# Patient Record
Sex: Female | Born: 1935 | Race: White | Hispanic: No | Smoking: Current every day smoker
Health system: Southern US, Community
[De-identification: ages and names within clinical notes are randomized; demographics above are authoritative.]

## PROBLEM LIST (undated history)

## (undated) DIAGNOSIS — C801 Malignant (primary) neoplasm, unspecified: Secondary | ICD-10-CM

## (undated) DIAGNOSIS — I10 Essential (primary) hypertension: Secondary | ICD-10-CM

## (undated) DIAGNOSIS — R011 Cardiac murmur, unspecified: Secondary | ICD-10-CM

## (undated) DIAGNOSIS — J449 Chronic obstructive pulmonary disease, unspecified: Secondary | ICD-10-CM

## (undated) DIAGNOSIS — F329 Major depressive disorder, single episode, unspecified: Secondary | ICD-10-CM

## (undated) DIAGNOSIS — M1990 Unspecified osteoarthritis, unspecified site: Secondary | ICD-10-CM

## (undated) DIAGNOSIS — F32A Depression, unspecified: Secondary | ICD-10-CM

## (undated) DIAGNOSIS — M810 Age-related osteoporosis without current pathological fracture: Secondary | ICD-10-CM

## (undated) DIAGNOSIS — H269 Unspecified cataract: Secondary | ICD-10-CM

## (undated) HISTORY — DX: Depression, unspecified: F32.A

## (undated) HISTORY — DX: Unspecified cataract: H26.9

## (undated) HISTORY — PX: ABDOMINAL HYSTERECTOMY: SHX81

## (undated) HISTORY — DX: Unspecified osteoarthritis, unspecified site: M19.90

## (undated) HISTORY — DX: Chronic obstructive pulmonary disease, unspecified: J44.9

## (undated) HISTORY — DX: Age-related osteoporosis without current pathological fracture: M81.0

## (undated) HISTORY — DX: Major depressive disorder, single episode, unspecified: F32.9

## (undated) HISTORY — PX: EYE SURGERY: SHX253

## (undated) HISTORY — PX: BREAST SURGERY: SHX581

---

## 2008-07-19 ENCOUNTER — Emergency Department (HOSPITAL_BASED_OUTPATIENT_CLINIC_OR_DEPARTMENT_OTHER): Admission: EM | Admit: 2008-07-19 | Discharge: 2008-07-19 | Payer: Self-pay | Admitting: Emergency Medicine

## 2015-12-14 ENCOUNTER — Emergency Department (HOSPITAL_BASED_OUTPATIENT_CLINIC_OR_DEPARTMENT_OTHER)
Admission: EM | Admit: 2015-12-14 | Discharge: 2015-12-14 | Disposition: A | Discharge status: Home / Self Care | Payer: Medicare PPO | Attending: Emergency Medicine | Admitting: Emergency Medicine

## 2015-12-14 ENCOUNTER — Encounter (HOSPITAL_BASED_OUTPATIENT_CLINIC_OR_DEPARTMENT_OTHER): Payer: Self-pay | Admitting: *Deleted

## 2015-12-14 ENCOUNTER — Emergency Department (HOSPITAL_BASED_OUTPATIENT_CLINIC_OR_DEPARTMENT_OTHER): Payer: Medicare PPO

## 2015-12-14 DIAGNOSIS — Z79899 Other long term (current) drug therapy: Secondary | ICD-10-CM | POA: Insufficient documentation

## 2015-12-14 DIAGNOSIS — R0602 Shortness of breath: Secondary | ICD-10-CM | POA: Diagnosis not present

## 2015-12-14 DIAGNOSIS — F17210 Nicotine dependence, cigarettes, uncomplicated: Secondary | ICD-10-CM | POA: Insufficient documentation

## 2015-12-14 DIAGNOSIS — R0989 Other specified symptoms and signs involving the circulatory and respiratory systems: Secondary | ICD-10-CM | POA: Diagnosis not present

## 2015-12-14 DIAGNOSIS — R600 Localized edema: Secondary | ICD-10-CM | POA: Insufficient documentation

## 2015-12-14 DIAGNOSIS — I10 Essential (primary) hypertension: Secondary | ICD-10-CM | POA: Diagnosis not present

## 2015-12-14 DIAGNOSIS — R609 Edema, unspecified: Secondary | ICD-10-CM

## 2015-12-14 DIAGNOSIS — M7989 Other specified soft tissue disorders: Secondary | ICD-10-CM | POA: Diagnosis present

## 2015-12-14 HISTORY — DX: Cardiac murmur, unspecified: R01.1

## 2015-12-14 HISTORY — DX: Essential (primary) hypertension: I10

## 2015-12-14 HISTORY — DX: Malignant (primary) neoplasm, unspecified: C80.1

## 2015-12-14 LAB — PROTIME-INR
INR: 1.13 (ref 0.00–1.49)
Prothrombin Time: 14.7 seconds (ref 11.6–15.2)

## 2015-12-14 LAB — COMPREHENSIVE METABOLIC PANEL
ALBUMIN: 2.6 g/dL — AB (ref 3.5–5.0)
ALT: 38 U/L (ref 14–54)
AST: 77 U/L — AB (ref 15–41)
Alkaline Phosphatase: 184 U/L — ABNORMAL HIGH (ref 38–126)
Anion gap: 7 (ref 5–15)
BUN: 17 mg/dL (ref 6–20)
CHLORIDE: 103 mmol/L (ref 101–111)
CO2: 24 mmol/L (ref 22–32)
CREATININE: 0.77 mg/dL (ref 0.44–1.00)
Calcium: 8.3 mg/dL — ABNORMAL LOW (ref 8.9–10.3)
GFR calc Af Amer: >60 mL/min (ref >60)
GFR calc non Af Amer: >60 mL/min (ref >60)
Glucose, Bld: 154 mg/dL — ABNORMAL HIGH (ref 65–99)
POTASSIUM: 4.4 mmol/L (ref 3.5–5.1)
SODIUM: 134 mmol/L — AB (ref 135–145)
Total Bilirubin: 0.7 mg/dL (ref 0.3–1.2)
Total Protein: 5.9 g/dL — ABNORMAL LOW (ref 6.5–8.1)

## 2015-12-14 LAB — CBC
HCT: 35.1 % — ABNORMAL LOW (ref 36.0–46.0)
Hemoglobin: 11.7 g/dL — ABNORMAL LOW (ref 12.0–15.0)
MCH: 28.6 pg (ref 26.0–34.0)
MCHC: 33.3 g/dL (ref 30.0–36.0)
MCV: 85.8 fL (ref 78.0–100.0)
PLATELETS: 269 10*3/uL (ref 150–400)
RBC: 4.09 MIL/uL (ref 3.87–5.11)
RDW: 14.6 % (ref 11.5–15.5)
WBC: 10.9 10*3/uL — AB (ref 4.0–10.5)

## 2015-12-14 LAB — BRAIN NATRIURETIC PEPTIDE: B Natriuretic Peptide: 327.7 pg/mL — ABNORMAL HIGH (ref 0.0–100.0)

## 2015-12-14 LAB — TROPONIN I

## 2015-12-14 NOTE — ED Notes (Signed)
Patient transported to X-ray 

## 2015-12-14 NOTE — ED Notes (Signed)
Pt taken to restroom via wheelchair, pt able to ambulate short distance with no assistance, states with no problem or pain

## 2015-12-14 NOTE — Discharge Instructions (Signed)

## 2015-12-14 NOTE — ED Provider Notes (Signed)
CSN: [id number]     Arrival date & time 12/14/15  1135 History   First MD Initiated Contact with Patient 12/14/15 1153     Chief Complaint  Patient presents with  . Shortness of Breath  . Leg Swelling    HPI Over the last couple of weeks, the patient has developed swelling in her lower extremities. Patient had been living in another area in [place]. She had seen her primary doctor who started her on antibiotics. The symptoms seem to get a little better but then they returned. She was given a prescription for another course of antibiotics on Tuesday. Her primary doctor has  done some blood testing. Her white count was elevated at 15,000.  Patient also had a recent echocardiogram. Her doctor was planning on getting ultrasounds of her lower extremities and also a CT scan of her abdomen pelvis because she has noted some swelling in her lower abdominal area. She feels like her lower abdomen used to be a flatter. The patient's family brought her to live up in this area now. They're trying to get her established with a primary care doctor. They went to one of the clinics upstairs and they suggested she come down to the emergency room. Patient has started having some trouble with shortness of breath. This is a new issue that started last evening. The symptoms primarily occur when she is walking around. She is denying any trouble with chest pain. Patient is a daily smoker. Past Medical History  Diagnosis Date  . Cancer ([place])   . Hypertension   . Heart murmur    Past Surgical History  Procedure Laterality Date  . Breast surgery    . Abdominal hysterectomy     No family history on file. Social History  Substance Use Topics  . Smoking status: Current Every Day Smoker -- 1.00 packs/day    Types: Cigarettes  . Smokeless tobacco: None  . Alcohol Use: Yes   OB History    No data available     Review of Systems  All other systems reviewed and are negative.     Allergies  Review of  patient's allergies indicates no known allergies.  Home Medications   Prior to Admission medications   Medication Sig Start Date End Date Taking? Authorizing Provider  anastrozole (ARIMIDEX) 1 MG tablet Take 1 mg by mouth daily.   Yes Historical Provider, MD  GABAPENTIN, ONCE-DAILY, PO Take by mouth.   Yes Historical Provider, MD  quinapril (ACCUPRIL) 5 MG tablet Take 5 mg by mouth at bedtime.   Yes Historical Provider, MD  Sulfamethoxazole-Trimethoprim (BACTRIM DS PO) Take by mouth.   Yes Historical Provider, MD   BP 130/64 mmHg  Pulse 65  Temp(Src) 98.1 F (36.7 C) (Oral)  Resp 20  Ht 5\' 3"  (1.6 m)  Wt 44.906 kg  BMI 17.54 kg/m2  SpO2 97% Physical Exam  Constitutional: No distress.  HENT:  Head: Normocephalic and atraumatic.  Right Ear: External ear normal.  Left Ear: External ear normal.  Eyes: Conjunctivae are normal. Right eye exhibits no discharge. Left eye exhibits no discharge. No scleral icterus.  Neck: Neck supple. No tracheal deviation present.  Cardiovascular: Normal rate, regular rhythm and intact distal pulses.   Palpable dorsalis pedis pulses bilaterally,  Pulmonary/Chest: Effort normal. No stridor. No respiratory distress. She has no wheezes. She has rales (few crackles at both bases).  Abdominal: Soft. Bowel sounds are normal. She exhibits no distension and no mass. There is no tenderness.  There is no rebound and no guarding.  No tenderness or appreciable masses  Musculoskeletal: She exhibits no edema or tenderness.  Soft edema bilateral feet that doesn't help her ankles but no calf tenderness or erythema or swelling, bruising type discoloration of the medial aspect of right lower extremity  Neurological: She is alert. She has normal strength. No cranial nerve deficit (no facial droop, extraocular movements intact, no slurred speech) or sensory deficit. She exhibits normal muscle tone. She displays no seizure activity. Coordination normal.  Skin: Skin is warm and  dry. No rash noted.  Dusky skin discoloration of the plantar aspects of both of her feet, dorsal aspect appears patent  Psychiatric: She has a normal mood and affect.  Nursing note and vitals reviewed.   ED Course  Procedures (including critical care time) Labs Review Labs Reviewed  CBC - Abnormal; Notable for the following:    WBC 10.9 (*)    Hemoglobin 11.7 (*)    HCT 35.1 (*)    All other components within normal limits  COMPREHENSIVE METABOLIC PANEL - Abnormal; Notable for the following:    Sodium 134 (*)    Glucose, Bld 154 (*)    Calcium 8.3 (*)    Total Protein 5.9 (*)    Albumin 2.6 (*)    AST 77 (*)    Alkaline Phosphatase 184 (*)    All other components within normal limits  BRAIN NATRIURETIC PEPTIDE - Abnormal; Notable for the following:    B Natriuretic Peptide 327.7 (*)    All other components within normal limits  TROPONIN I  PROTIME-INR    Imaging Review Dg Chest 2 View  12/14/2015  CLINICAL DATA:  Shortness of Breath EXAM: CHEST  2 VIEW COMPARISON:  None. FINDINGS: There is mild elevation of the right hemidiaphragm. There is no apparent edema or consolidation. The heart size and pulmonary vascularity are normal. No adenopathy. No bone lesions. IMPRESSION: Elevation right hemidiaphragm.  No edema or consolidation. Electronically Signed   By: Lowella Grip III M.D.   On: 12/14/2015 13:38   US Venous Img Lower Bilateral  12/14/2015  CLINICAL DATA:  Bilateral leg swelling for 2-3 weeks EXAM: BILATERAL LOWER EXTREMITY VENOUS DOPPLER ULTRASOUND TECHNIQUE: Gray-scale sonography with graded compression, as well as color Doppler and duplex ultrasound were performed to evaluate the lower extremity deep venous systems from the level of the common femoral vein and including the common femoral, femoral, profunda femoral, popliteal and calf veins including the posterior tibial, peroneal and gastrocnemius veins when visible. The superficial great saphenous vein was also  interrogated. Spectral Doppler was utilized to evaluate flow at rest and with distal augmentation maneuvers in the common femoral, femoral and popliteal veins. COMPARISON:  None. FINDINGS: RIGHT LOWER EXTREMITY Common Femoral Vein: No evidence of thrombus. Normal compressibility, respiratory phasicity and response to augmentation. Saphenofemoral Junction: No evidence of thrombus. Normal compressibility and flow on color Doppler imaging. Profunda Femoral Vein: No evidence of thrombus. Normal compressibility and flow on color Doppler imaging. Femoral Vein: No evidence of thrombus. Normal compressibility, respiratory phasicity and response to augmentation. Popliteal Vein: No evidence of thrombus. Normal compressibility, respiratory phasicity and response to augmentation. Calf Veins: No evidence of thrombus. Normal compressibility and flow on color Doppler imaging. Superficial Great Saphenous Vein: No evidence of thrombus. Normal compressibility and flow on color Doppler imaging. Venous Reflux:  None. Other Findings:  None. LEFT LOWER EXTREMITY Common Femoral Vein: No evidence of thrombus. Normal compressibility, respiratory phasicity and response to augmentation.  Saphenofemoral Junction: No evidence of thrombus. Normal compressibility and flow on color Doppler imaging. Profunda Femoral Vein: No evidence of thrombus. Normal compressibility and flow on color Doppler imaging. Femoral Vein: No evidence of thrombus. Normal compressibility, respiratory phasicity and response to augmentation. Popliteal Vein: No evidence of thrombus. Normal compressibility, respiratory phasicity and response to augmentation. Calf Veins: No evidence of thrombus. Normal compressibility and flow on color Doppler imaging. Superficial Great Saphenous Vein: No evidence of thrombus. Normal compressibility and flow on color Doppler imaging. Venous Reflux:  None. Other Findings:  None. IMPRESSION: No evidence of deep venous thrombosis. Electronically  Signed   By: Inez Catalina M.D.   On: 12/14/2015 13:31   I have personally reviewed and evaluated these images and lab results as part of my medical decision-making.   EKG Interpretation   Date/Time:  Friday December 14 2015 11:54:57 EDT Ventricular Rate:  77 PR Interval:  150 QRS Duration: 76 QT Interval:  373 QTC Calculation: 422 R Axis:   -7 Text Interpretation:  Sinus rhythm Probable left atrial enlargement  Anteroseptal infarct, age indeterminate No old tracing to compare  Confirmed by Thorn Demas  MD-J, Lesly Joslyn [id number]) on 12/14/2015 11:59:36 AM      MDM   Final diagnoses:  Edema    Patient's laboratory tests are reassuring. White blood cell count is decreasing. Her electrolytes are unremarkable with the exception of mildly increased LFTs with a normal bilirubin. I do not think that is significant. She does have a low protein level. This may be a contributing factor to her peripheral edema  Doppler ultrasounds did not show evidence of DVT. Patient is not tachycardic or tachypneic. I doubt embolism.  Chest x-ray does not show evidence of congestive heart failure and her BNP and troponin are reassuring.  I recommended compression stockings. I think the patient is stable to follow up with primary care doctor for further evaluation.      Dorie Rank, MD 12/14/15 [phone]

## 2015-12-14 NOTE — ED Notes (Signed)
Swelling in her abdomen and feet x 2 weeks. This am she was SOB. She has a new murmur found 2 weeks ago. Her MD knows about the symptoms she is having today and has scheduled OP testing. Her WBC is 15,000 from blood drawn 3 days ago. She was started on antibiotics Tuesday.

## 2015-12-17 ENCOUNTER — Ambulatory Visit: Payer: Self-pay | Admitting: Family Medicine

## 2015-12-17 ENCOUNTER — Encounter (HOSPITAL_BASED_OUTPATIENT_CLINIC_OR_DEPARTMENT_OTHER): Payer: Self-pay

## 2015-12-17 ENCOUNTER — Telehealth: Payer: Self-pay | Admitting: Family Medicine

## 2015-12-17 ENCOUNTER — Emergency Department (HOSPITAL_BASED_OUTPATIENT_CLINIC_OR_DEPARTMENT_OTHER)
Admission: EM | Admit: 2015-12-17 | Discharge: 2015-12-17 | Disposition: A | Discharge status: Home / Self Care | Payer: Medicare PPO | Attending: Emergency Medicine | Admitting: Emergency Medicine

## 2015-12-17 ENCOUNTER — Emergency Department (HOSPITAL_BASED_OUTPATIENT_CLINIC_OR_DEPARTMENT_OTHER): Payer: Medicare PPO

## 2015-12-17 DIAGNOSIS — Y9289 Other specified places as the place of occurrence of the external cause: Secondary | ICD-10-CM | POA: Insufficient documentation

## 2015-12-17 DIAGNOSIS — Y9389 Activity, other specified: Secondary | ICD-10-CM | POA: Diagnosis not present

## 2015-12-17 DIAGNOSIS — R600 Localized edema: Secondary | ICD-10-CM | POA: Diagnosis not present

## 2015-12-17 DIAGNOSIS — Z79899 Other long term (current) drug therapy: Secondary | ICD-10-CM | POA: Insufficient documentation

## 2015-12-17 DIAGNOSIS — S0990XA Unspecified injury of head, initial encounter: Secondary | ICD-10-CM | POA: Diagnosis present

## 2015-12-17 DIAGNOSIS — Y998 Other external cause status: Secondary | ICD-10-CM | POA: Diagnosis not present

## 2015-12-17 DIAGNOSIS — Z859 Personal history of malignant neoplasm, unspecified: Secondary | ICD-10-CM | POA: Insufficient documentation

## 2015-12-17 DIAGNOSIS — W01198A Fall on same level from slipping, tripping and stumbling with subsequent striking against other object, initial encounter: Secondary | ICD-10-CM | POA: Diagnosis not present

## 2015-12-17 DIAGNOSIS — R160 Hepatomegaly, not elsewhere classified: Secondary | ICD-10-CM | POA: Insufficient documentation

## 2015-12-17 DIAGNOSIS — S0003XA Contusion of scalp, initial encounter: Secondary | ICD-10-CM | POA: Diagnosis not present

## 2015-12-17 DIAGNOSIS — I10 Essential (primary) hypertension: Secondary | ICD-10-CM | POA: Insufficient documentation

## 2015-12-17 DIAGNOSIS — F1010 Alcohol abuse, uncomplicated: Secondary | ICD-10-CM | POA: Diagnosis not present

## 2015-12-17 DIAGNOSIS — R011 Cardiac murmur, unspecified: Secondary | ICD-10-CM | POA: Diagnosis not present

## 2015-12-17 DIAGNOSIS — W19XXXA Unspecified fall, initial encounter: Secondary | ICD-10-CM

## 2015-12-17 DIAGNOSIS — F17210 Nicotine dependence, cigarettes, uncomplicated: Secondary | ICD-10-CM | POA: Insufficient documentation

## 2015-12-17 NOTE — Discharge Instructions (Signed)
Facial or Scalp Contusion ° A facial or scalp contusion is a deep bruise on the face or head. Contusions happen when an injury causes bleeding under the skin. Signs of bruising include pain, puffiness (swelling), and discolored skin. The contusion may turn blue, purple, or yellow. °HOME CARE °· Only take medicines as told by your doctor. °· Put ice on the injured area. °¨ Put ice in a plastic bag. °¨ Place a towel between your skin and the bag. °¨ Leave the ice on for 20 minutes, 2-3 times a day. °GET HELP IF: °· You have bite problems. °· You have pain when chewing. °· You are worried about your face not healing normally. °GET HELP RIGHT AWAY IF:  °· You have severe pain or a headache and medicine does not help. °· You are very tired or confused, or your personality changes. °· You throw up (vomit). °· You have a nosebleed that will not stop. °· You see two of everything (double vision) or have blurry vision. °· You have fluid coming from your nose or ear. °· You have problems walking or using your arms or legs. °MAKE SURE YOU:  °· Understand these instructions. °· Will watch your condition. °· Will get help right away if you are not doing well or get worse. °  °This information is not intended to replace advice given to you by your health care provider. Make sure you discuss any questions you have with your health care provider. °  °Document Released: 07/31/2011 Document Revised: 09/01/2014 Document Reviewed: 03/24/2013 °Elsevier Interactive Patient Education ©2016 [employer] ° °

## 2015-12-17 NOTE — Telephone Encounter (Signed)
Pts daughter called stating pt fell coming into the building for her 11:30am appt and is downstairs in the ER. They have rescheduled for 4/26 and apologized for the late notice. I'm assuming no charge?

## 2015-12-17 NOTE — ED Notes (Signed)
Pt was coming in from outside to a clinic visit and had mechanical fall on curb.  No loc.  Denies being on blood thinners.  Bleeding to back of head.

## 2015-12-17 NOTE — ED Notes (Signed)
Wound cleaned and 4x4 applied no laceration. Wrapped with kerlex. Teaching done on drsg changes. Pt tolerated well.

## 2015-12-17 NOTE — ED Provider Notes (Addendum)
CSN: [id number]     Arrival date & time 12/17/15  1128 History   First MD Initiated Contact with Patient 12/17/15 1142     Chief Complaint  Patient presents with  . Fall     (Consider location/radiation/quality/duration/timing/severity/associated sxs/prior Treatment) HPI Comments: Patient is an 82 year old female with a history of hypertension, cancer and chronic alcohol use presenting today after a fall. Patient was coming to see her PCP to establish care when she got out of car and did not see the unevenness in the pavement which caused her to lose her balance and fall backwards hitting her head on the cement. She initially had a headache but denies any loss of consciousness, vision change or upper or lower extremity weakness or pain. She denies taking any anticoagulation. Family states she has an unsteady gait chronically but felt that she had been doing well and improving from the last time she was seen in the emergency room.  Patient recently moved here to live with family and was seen last week for lower extremity edema and shortness of breath. Patient was found to have low protein and elevated LFTs which were thought to be causing some of her peripheral edema. Per family patient drank chronically and felt that she had poor nutrition which has improved significantly since living with them. They also note that the lower extremity edema has significantly improved as well as her shortness of breath.  Patient is a 82 y.o. female presenting with fall. The history is provided by the patient and a relative.  Fall    Past Medical History  Diagnosis Date  . Cancer ([place])   . Hypertension   . Heart murmur    Past Surgical History  Procedure Laterality Date  . Breast surgery    . Abdominal hysterectomy     No family history on file. Social History  Substance Use Topics  . Smoking status: Current Every Day Smoker -- 1.00 packs/day    Types: Cigarettes  . Smokeless tobacco: None  .  Alcohol Use: Yes   OB History    No data available     Review of Systems  All other systems reviewed and are negative.     Allergies  Review of patient's allergies indicates no known allergies.  Home Medications   Prior to Admission medications   Medication Sig Start Date End Date Taking? Authorizing Provider  anastrozole (ARIMIDEX) 1 MG tablet Take 1 mg by mouth daily.    Historical Provider, MD  GABAPENTIN, ONCE-DAILY, PO Take by mouth.    Historical Provider, MD  quinapril (ACCUPRIL) 5 MG tablet Take 5 mg by mouth at bedtime.    Historical Provider, MD  Sulfamethoxazole-Trimethoprim (BACTRIM DS PO) Take by mouth.    Historical Provider, MD   BP 169/77 mmHg  Pulse 77  Temp(Src) 97.7 F (36.5 C) (Oral)  Resp 18  Ht 5\' 3"  (1.6 m)  Wt 99 lb (44.906 kg)  BMI 17.54 kg/m2  SpO2 97% Physical Exam  Constitutional: She is oriented to person, place, and time. She appears well-developed and well-nourished. No distress.  HENT:  Head: Normocephalic. Head is with laceration.    Mouth/Throat: Oropharynx is clear and moist.  Large egg shaped hematoma to the occiput  Eyes: Conjunctivae and EOM are normal. Pupils are equal, round, and reactive to light.  Neck: Normal range of motion. Neck supple. No spinous process tenderness and no muscular tenderness present.  Cardiovascular: Normal rate, regular rhythm and intact distal pulses.   No  murmur heard. Pulmonary/Chest: Effort normal and breath sounds normal. No respiratory distress. She has no wheezes. She has no rales.  Abdominal: Soft. She exhibits no distension. There is hepatomegaly. There is no tenderness. There is no rebound, no guarding and no CVA tenderness.  Musculoskeletal: Normal range of motion. She exhibits edema. She exhibits no tenderness.  1+ edema in bilateral ankles  Neurological: She is alert and oriented to person, place, and time.  Skin: Skin is warm and dry. No rash noted. No erythema.  Psychiatric: She has a  normal mood and affect. Her behavior is normal.  Nursing note and vitals reviewed.   ED Course  Procedures (including critical care time) Labs Review Labs Reviewed - No data to display  Imaging Review Ct Head Wo Contrast  12/17/2015  CLINICAL DATA:  Fall today, hit back of head on concrete. Large hematoma well laceration. EXAM: CT HEAD WITHOUT CONTRAST CT CERVICAL SPINE WITHOUT CONTRAST TECHNIQUE: Multidetector CT imaging of the head and cervical spine was performed following the standard protocol without intravenous contrast. Multiplanar CT image reconstructions of the cervical spine were also generated. COMPARISON:  None. FINDINGS: CT HEAD FINDINGS Posterior scalp hematoma. No underlying calvarial abnormality. Old infarct in the left frontal lobe. There is atrophy and chronic small vessel disease changes. No acute intracranial abnormality. Specifically, no hemorrhage, hydrocephalus, mass lesion, acute infarction, or significant intracranial injury. No acute calvarial abnormality. Visualized paranasal sinuses and mastoids clear. Orbital soft tissues unremarkable. CT CERVICAL SPINE FINDINGS Normal alignment. Prevertebral soft tissues are normal. Diffuse degenerative disc and facet disease. No fracture. No epidural or paraspinal hematoma. IMPRESSION: No acute intracranial abnormality. Atrophy, chronic microvascular disease. Old left frontal infarct. Cervical spondylosis.  No acute bony abnormality. Electronically Signed   By: Rolm Baptise M.D.   On: 12/17/2015 12:52   Ct Cervical Spine Wo Contrast  12/17/2015  CLINICAL DATA:  Fall today, hit back of head on concrete. Large hematoma well laceration. EXAM: CT HEAD WITHOUT CONTRAST CT CERVICAL SPINE WITHOUT CONTRAST TECHNIQUE: Multidetector CT imaging of the head and cervical spine was performed following the standard protocol without intravenous contrast. Multiplanar CT image reconstructions of the cervical spine were also generated. COMPARISON:  None.  FINDINGS: CT HEAD FINDINGS Posterior scalp hematoma. No underlying calvarial abnormality. Old infarct in the left frontal lobe. There is atrophy and chronic small vessel disease changes. No acute intracranial abnormality. Specifically, no hemorrhage, hydrocephalus, mass lesion, acute infarction, or significant intracranial injury. No acute calvarial abnormality. Visualized paranasal sinuses and mastoids clear. Orbital soft tissues unremarkable. CT CERVICAL SPINE FINDINGS Normal alignment. Prevertebral soft tissues are normal. Diffuse degenerative disc and facet disease. No fracture. No epidural or paraspinal hematoma. IMPRESSION: No acute intracranial abnormality. Atrophy, chronic microvascular disease. Old left frontal infarct. Cervical spondylosis.  No acute bony abnormality. Electronically Signed   By: Rolm Baptise M.D.   On: 12/17/2015 12:52   I have personally reviewed and evaluated these images and lab results as part of my medical decision-making.   EKG Interpretation None      MDM   Final diagnoses:  Fall, initial encounter  Scalp hematoma, initial encounter    Patient is an elderly female who presents today after a fall. She was going to follow-up as a new patient upstairs when she got out of the car and lost her balance falling backwards hitting her head. She takes no anticoagulation and denies loss of consciousness. She denies any numbness or weakness in her upper or lower extremities. Mental status is  within normal limits. Will scan her head and neck to ensure no underlying injury. We'll then evaluate laceration for repair.  Patient was just seen last week for swelling of her lower extremities and abdomen. She completed a course of antibiotics and states symptoms seem to be getting much better and the swelling has significantly improved. She recently came to live with her family here in the states she's been eating well but has been unsteady on her feet. She does have a history of  significant alcohol use which is most likely the cause of her symptoms of swelling related to possible malnutrition. She also has hepatomegaly area however today that does not appear to be the cause of her fall. She may have some vitamin deficiencies that cause her to have difficulty with her gait versus damage from chronic alcohol use.  Head CT and C-spine imaging pending  1:20 PM Imaging negative for acute findings. Wound care applied but no laceration or requiring repair.  Patient will follow up with her PCP on Wednesday  Joneisha Miles, MD 12/17/15 1321  Blanchie Dessert, MD 12/17/15 1322

## 2015-12-17 NOTE — ED Notes (Signed)
CT noticed bleeding on sheets behind head. Pt turned while holding c-spine. Hematoma noted to left back of head and bleeding controlled. Pressure drsg with 4x4 gauze and wrapped with kerlex drsg. Pt tolerated well.

## 2015-12-18 NOTE — Telephone Encounter (Signed)
-----   Message from Darreld Mclean, MD sent at 12/17/2015 12:34 PM EDT ----- Yes, please no charge!

## 2015-12-19 ENCOUNTER — Encounter: Payer: Self-pay | Admitting: Family Medicine

## 2015-12-19 ENCOUNTER — Ambulatory Visit (INDEPENDENT_AMBULATORY_CARE_PROVIDER_SITE_OTHER): Payer: Medicare PPO | Admitting: Family Medicine

## 2015-12-19 VITALS — BP 111/59 | HR 82 | Temp 97.5°F | Ht 62.5 in | Wt 97.2 lb

## 2015-12-19 DIAGNOSIS — J439 Emphysema, unspecified: Secondary | ICD-10-CM | POA: Insufficient documentation

## 2015-12-19 DIAGNOSIS — R54 Age-related physical debility: Secondary | ICD-10-CM | POA: Insufficient documentation

## 2015-12-19 DIAGNOSIS — R160 Hepatomegaly, not elsewhere classified: Secondary | ICD-10-CM

## 2015-12-19 DIAGNOSIS — C50912 Malignant neoplasm of unspecified site of left female breast: Secondary | ICD-10-CM | POA: Diagnosis not present

## 2015-12-19 DIAGNOSIS — R945 Abnormal results of liver function studies: Secondary | ICD-10-CM

## 2015-12-19 DIAGNOSIS — R7989 Other specified abnormal findings of blood chemistry: Secondary | ICD-10-CM

## 2015-12-19 DIAGNOSIS — T5191XA Toxic effect of unspecified alcohol, accidental (unintentional), initial encounter: Secondary | ICD-10-CM

## 2015-12-19 NOTE — Patient Instructions (Addendum)
We will arrange for your to see the oncologist here regarding your history of breast cancer I will also arrange for an ultrasound of your liver.   Please continue to cut down on your tobacco and alcohol- remember to decrease alcohol gradually if you have been drinking for a long time to prevent withdrawal I would also encourage you to continue to try and quit smoking!  I do think that an inhaler would help with your breathing- let me know if you are interested in this.

## 2015-12-19 NOTE — Progress Notes (Signed)
[place] at [facility] [address] [place], [place] [address] [phone]  Date:  12/19/2015   Name:  Emily Moore   DOB:  November 21, 1929   MRN:  [id number]  PCP:  Lamar Blinks, MD    Chief Complaint: Establish Care   History of Present Illness:  Emily Moore is a 82 y.o. very pleasant female patient who presents with the following:  Here today to establish care.  She is new to the area- she had living near [place].  She has moved in with her son and DIL. She will be living with them now.   She was supposed to see me on Monday but she tripped and fell and had to go to the ER. She had a wound on her head that did not need stitches, and CT of head and spine were normal.  She had previously been seen at the ER a week prior due to swelling in her legs up to her belly.  This was thought due to cellulitis She was on abx for presumed cellulitis. She did not have any evidence of ACS or CHF during her ER evaluation.    Her swelling is "so much better," her legs and stomach are now back to normal.  She is taking septra BID for 2 weeks- this is going well, she has been through about one week so far  She uses gabapentin once or twice a day for back pain. Taking twice a day now since she fell On chronic arimidex daily for history of breast cancer- dx 10/ 2015. She had a left mastectomy, no radiation. She will need to see an oncologist here.  Will arrange for her to see Dr. Marin Olp She is having some memory problems and fatigue- they think this could be a SE of her arimidex and wonder if this might need to be changed  She has a history of chronically drinking alcohol.  Vodka- she might have "2 drinks" a day per her report.  Since she has moved in with her family she is cutting back on drinking and also on smoking. She has smoked since she was 77.  She does carry a dx of COPD- not on any inhaler at this point. She does not wish to be on any  inhaler right now.  Admits that her breathing is not great but she feels like her exercise status is satisfactory   BP Readings from Last 3 Encounters:  12/17/15 142/74  12/14/15 124/66      There are no active problems to display for this patient.   Past Medical History  Diagnosis Date  . Cancer ([place])   . Hypertension   . Heart murmur   . Arthritis   . Cataract   . COPD (chronic obstructive pulmonary disease) ([place])   . Depression   . Osteoporosis     Past Surgical History  Procedure Laterality Date  . Breast surgery    . Abdominal hysterectomy      Social History  Substance Use Topics  . Smoking status: Current Every Day Smoker -- 1.00 packs/day    Types: Cigarettes  . Smokeless tobacco: None  . Alcohol Use: Yes    Family History  Problem Relation Age of Onset  . Heart disease Mother   . Cancer Father     No Known Allergies  Medication list has been reviewed and updated.  Current Outpatient Prescriptions on File Prior to Visit  Medication Sig Dispense  Refill  . anastrozole (ARIMIDEX) 1 MG tablet Take 1 mg by mouth daily.    Marland Kitchen GABAPENTIN, ONCE-DAILY, PO Take by mouth.    . quinapril (ACCUPRIL) 5 MG tablet Take 5 mg by mouth at bedtime.    . Sulfamethoxazole-Trimethoprim (BACTRIM DS PO) Take by mouth.     No current facility-administered medications on file prior to visit.    Review of Systems:  As per HPI- otherwise negative.   Physical Examination: Filed Vitals:   12/19/15 1032  Pulse: 82  Temp: 97.5 F (36.4 C)   Filed Vitals:   12/19/15 1032  Height: 5' 2.5" (1.588 m)  Weight: 97 lb 3.2 oz (44.09 kg)   Body mass index is 17.48 kg/(m^2). Ideal Body Weight: Weight in (lb) to have BMI = 25: 138.6  GEN: WDWN, NAD, Non-toxic, A & O x 3, thin, frail appearance.  In good spirits HEENT: Atraumatic, Normocephalic. Neck supple. No masses, No LAD.  Bilateral TM wnl, oropharynx normal.  PEERL,EOMI.   Small hematoma with scab on the back of her  head- appears stable Ears and Nose: No external deformity. CV: RRR, No M/G/R. No JVD. No thrill. No extra heart sounds. PULM: CTA B, no wheezes, crackles, rhonchi. No retractions. No resp. distress. No accessory muscle use. ABD: S, NT, ND, +BS. No rebound. Liver does seem enlarged- she was also told this at the ER  EXTR: No c/c/e.  No swelling of her legs or abdomen at this time  NEURO Normal gait.  PSYCH: Normally interactive. Conversant. Not depressed or anxious appearing.  Calm demeanor.   BP Readings from Last 3 Encounters:  12/19/15 111/59  12/17/15 142/74  12/14/15 124/66   Assessment and Plan: Frailty  Pulmonary emphysema, unspecified emphysema type ([place])  Malignant neoplasm of left female breast, unspecified site of breast ([place]) - Plan: Ambulatory referral to Oncology  Liver enlargement - Plan: US Abdomen Limited RUQ  Elevated LFTs - Plan: US Abdomen Limited RUQ  Alcohol causing toxic effect, initial encounter - Plan: US Abdomen Limited RUQ  Here today to establish care Will arrange an [place] to evaluate her enlarged liver and elevated LFTs. This is likely related to alcohol overuse- she is working on cutting down  Referral to oncology to follow-up on history of breast cancer She is now living with family which is a positive.  We hope that her nutrition status will improve with their support  We will arrange for your to see the oncologist here regarding your history of breast cancer I will also arrange for an ultrasound of your liver.   Please continue to cut down on your tobacco and alcohol- remember to decrease alcohol gradually if you have been drinking for a long time to prevent withdrawal I would also encourage you to continue to try and quit smoking!  I do think that an inhaler would help with your breathing- let me know if you are interested in this.   Signed Lamar Blinks, MD

## 2015-12-19 NOTE — Progress Notes (Signed)
Pre visit review using our clinic tool,if applicable. No additional management support is needed unless otherwise documented below in the visit note.  

## 2015-12-21 ENCOUNTER — Encounter (HOSPITAL_BASED_OUTPATIENT_CLINIC_OR_DEPARTMENT_OTHER): Payer: Self-pay | Admitting: *Deleted

## 2015-12-21 ENCOUNTER — Emergency Department (HOSPITAL_BASED_OUTPATIENT_CLINIC_OR_DEPARTMENT_OTHER)
Admission: EM | Admit: 2015-12-21 | Discharge: 2015-12-21 | Disposition: A | Discharge status: Home / Self Care | Payer: Medicare PPO | Attending: Emergency Medicine | Admitting: Emergency Medicine

## 2015-12-21 DIAGNOSIS — Y929 Unspecified place or not applicable: Secondary | ICD-10-CM | POA: Insufficient documentation

## 2015-12-21 DIAGNOSIS — Z23 Encounter for immunization: Secondary | ICD-10-CM | POA: Diagnosis not present

## 2015-12-21 DIAGNOSIS — S8991XA Unspecified injury of right lower leg, initial encounter: Secondary | ICD-10-CM | POA: Diagnosis present

## 2015-12-21 DIAGNOSIS — Y999 Unspecified external cause status: Secondary | ICD-10-CM | POA: Diagnosis not present

## 2015-12-21 DIAGNOSIS — F17210 Nicotine dependence, cigarettes, uncomplicated: Secondary | ICD-10-CM | POA: Diagnosis not present

## 2015-12-21 DIAGNOSIS — W1839XA Other fall on same level, initial encounter: Secondary | ICD-10-CM | POA: Diagnosis not present

## 2015-12-21 DIAGNOSIS — J449 Chronic obstructive pulmonary disease, unspecified: Secondary | ICD-10-CM | POA: Diagnosis not present

## 2015-12-21 DIAGNOSIS — Y9389 Activity, other specified: Secondary | ICD-10-CM | POA: Diagnosis not present

## 2015-12-21 DIAGNOSIS — I951 Orthostatic hypotension: Secondary | ICD-10-CM | POA: Diagnosis not present

## 2015-12-21 DIAGNOSIS — F329 Major depressive disorder, single episode, unspecified: Secondary | ICD-10-CM | POA: Diagnosis not present

## 2015-12-21 DIAGNOSIS — I10 Essential (primary) hypertension: Secondary | ICD-10-CM | POA: Diagnosis not present

## 2015-12-21 DIAGNOSIS — W19XXXA Unspecified fall, initial encounter: Secondary | ICD-10-CM

## 2015-12-21 DIAGNOSIS — M810 Age-related osteoporosis without current pathological fracture: Secondary | ICD-10-CM | POA: Insufficient documentation

## 2015-12-21 DIAGNOSIS — S81811A Laceration without foreign body, right lower leg, initial encounter: Secondary | ICD-10-CM

## 2015-12-21 LAB — URINALYSIS, ROUTINE W REFLEX MICROSCOPIC
BILIRUBIN URINE: NEGATIVE
GLUCOSE, UA: NEGATIVE mg/dL
HGB URINE DIPSTICK: NEGATIVE
Ketones, ur: NEGATIVE mg/dL
Nitrite: NEGATIVE
PROTEIN: NEGATIVE mg/dL
SPECIFIC GRAVITY, URINE: 1.02 (ref 1.005–1.030)
pH: 5.5 (ref 5.0–8.0)

## 2015-12-21 LAB — URINE MICROSCOPIC-ADD ON: RBC / HPF: NONE SEEN RBC/hpf (ref 0–5)

## 2015-12-21 MED ORDER — TETANUS-DIPHTH-ACELL PERTUSSIS 5-2.5-18.5 LF-MCG/0.5 IM SUSP
0.5000 mL | Freq: Once | INTRAMUSCULAR | Status: AC
  Administered 2015-12-21: 0.5 mL via INTRAMUSCULAR
  Filled 2015-12-21: qty 0.5

## 2015-12-21 MED ORDER — BACITRACIN-NEOMYCIN-POLYMYXIN OINTMENT TUBE
TOPICAL_OINTMENT | Freq: Every day | CUTANEOUS | Status: DC
  Filled 2015-12-21: qty 15

## 2015-12-21 NOTE — Discharge Instructions (Signed)
Skin Tear Care A skin tear is a wound in which the top layer of skin has peeled off. This is a common problem with aging because the skin becomes thinner and more fragile as a person gets older. In addition, some medicines, such as oral corticosteroids, can lead to skin thinning if taken for long periods of time.  A skin tear is often repaired with tape or skin adhesive strips. This keeps the skin that has been peeled off in contact with the healthier skin beneath. Depending on the location of the wound, a bandage (dressing) may be applied over the tape or skin adhesive strips. Sometimes, during the healing process, the skin turns black and dies. Even when this happens, the torn skin acts as a good dressing until the skin underneath gets healthier and repairs itself. HOME CARE INSTRUCTIONS   Change dressings once per day or as directed by your caregiver.  Gently clean the skin tear and the area around the tear using saline solution or mild soap and water.  Do not rub the injured skin dry. Let the area air dry.  Apply petroleum jelly or an antibiotic cream or ointment to keep the tear moist. This will help the wound heal. Do not allow a scab to form.  If the dressing sticks before the next dressing change, moisten it with warm soapy water and gently remove it.  Protect the injured skin until it has healed.  Only take over-the-counter or prescription medicines as directed by your caregiver.  Take showers or baths using warm soapy water. Apply a new dressing after the shower or bath.  Keep all follow-up appointments as directed by your caregiver.  SEEK IMMEDIATE MEDICAL CARE IF:   You have redness, swelling, or increasing pain in the skin tear.  You havepus coming from the skin tear.  You have chills.  You have a red streak that goes away from the skin tear.  You have a bad smell coming from the tear or dressing.  You have a fever or persistent symptoms for more than 2-3  days.  You have a fever and your symptoms suddenly get worse. MAKE SURE YOU:  Understand these instructions.  Will watch this condition.  Will get help right away if your child is not doing well or gets worse.   This information is not intended to replace advice given to you by your health care provider. Make sure you discuss any questions you have with your health care provider.   Document Released: 05/06/2001 Document Revised: 05/05/2012 Document Reviewed: 02/23/2012 Elsevier Interactive Patient Education 2016 Elsevier Inc.  Orthostatic Hypotension Orthostatic hypotension is a sudden drop in blood pressure. It happens when you quickly stand up from a seated or lying position. You may feel dizzy or light-headed. This can last for just a few seconds or for up to a few minutes. It is usually not a serious problem. However, if this happens frequently or gets worse, it can be a sign of something more serious. CAUSES  Different things can cause orthostatic hypotension, including:   Loss of body fluids (dehydration).  Medicines that lower blood pressure.  Sudden changes in posture, such as standing up quickly after you have been sitting or lying down.  Taking too much of your medicine. SIGNS AND SYMPTOMS   Light-headedness or dizziness.   Fainting or near-fainting.   A fast heart rate.   Weakness.   Feeling tired (fatigue).  DIAGNOSIS  Your health care provider may do several things to  help diagnose your condition and identify the cause. These may include:   Taking a medical history and doing a physical exam.  Checking your blood pressure. Your health care provider will check your blood pressure when you are:  Lying down.  Sitting.  Standing.  Using tilt table testing. In this test, you lie down on a table that moves from a lying position to a standing position. You will be strapped onto the table. This test monitors your blood pressure and heart rate when you are  in different positions. TREATMENT  Treatment will vary depending on the cause. Possible treatments include:   Changing the dosage of your medicines.  Wearing compression stockings on your lower legs.  Standing up slowly after sitting or lying down.  Eating more salt.  Eating frequent, small meals.  In some cases, getting IV fluids.  Taking medicine to enhance fluid retention. HOME CARE INSTRUCTIONS  Only take over-the-counter or prescription medicines as directed by your health care provider.  Follow your health care provider's instructions for changing the dosage of your current medicines.  Do not stop or adjust your medicine on your own.  Stand up slowly after sitting or lying down. This allows your body to adjust to the different position.  Wear compression stockings as directed.  Eat extra salt as directed.  Do not add extra salt to your diet unless directed to by your health care provider.  Eat frequent, small meals.  Avoid standing suddenly after eating.  Avoid hot showers or excessive heat as directed by your health care provider.  Keep all follow-up appointments. SEEK MEDICAL CARE IF:  You continue to feel dizzy or light-headed after standing.  You feel groggy or confused.  You feel cold, clammy, or sick to your stomach (nauseous).  You have blurred vision.  You feel short of breath. SEEK IMMEDIATE MEDICAL CARE IF:   You faint after standing.  You have chest pain.  You have difficulty breathing.   You lose feeling or movement in your arms or legs.   You have slurred speech or difficulty talking, or you are unable to talk.  MAKE SURE YOU:   Understand these instructions.  Will watch your condition.  Will get help right away if you are not doing well or get worse.   This information is not intended to replace advice given to you by your health care provider. Make sure you discuss any questions you have with your health care provider.    Document Released: 08/01/2002 Document Revised: 08/16/2013 Document Reviewed: 06/03/2013 Elsevier Interactive Patient Education [employer].

## 2015-12-21 NOTE — ED Notes (Signed)
Pt c/o skin tear/ laceration to right lower leg x 1 hr ago

## 2015-12-21 NOTE — ED Provider Notes (Signed)
CSN: [id number]     Arrival date & time 12/21/15  1555 History   First MD Initiated Contact with Patient 12/21/15 1609     Chief Complaint  Patient presents with  . Extremity Laceration     (Consider location/radiation/quality/duration/timing/severity/associated sxs/prior Treatment) HPI Patient presents after a fall from standing this afternoon. States she tripped going up the stairs and struck her right leg. Sustained a skin tear. Bleeding controlled. Unknown last tetanus. Denies any loss of consciousness. Patient's had several falls over the last few weeks. Intermittent shortness breath and dizziness. Denies any symptoms currently. She is being worked up by her primary physician for this. She's been on antibiotics for urinary tract infection. She's had no fever or chills. Denies any chest pain or abdominal pain no head or neck injury. No focal weakness or numbness. Past Medical History  Diagnosis Date  . Cancer ([place])   . Hypertension   . Heart murmur   . Arthritis   . Cataract   . COPD (chronic obstructive pulmonary disease) ([place])   . Depression   . Osteoporosis    Past Surgical History  Procedure Laterality Date  . Breast surgery    . Abdominal hysterectomy    . Eye surgery      cataract   Family History  Problem Relation Age of Onset  . Heart disease Mother   . Cancer Father    Social History  Substance Use Topics  . Smoking status: Current Every Day Smoker -- 1.00 packs/day    Types: Cigarettes  . Smokeless tobacco: None  . Alcohol Use: Yes   OB History    No data available     Review of Systems  Constitutional: Negative for fever, chills and appetite change.  Respiratory: Negative for chest tightness, shortness of breath and wheezing.   Cardiovascular: Negative for chest pain, palpitations and leg swelling.  Gastrointestinal: Negative for nausea, vomiting, abdominal pain and diarrhea.  Genitourinary: Negative for dysuria, frequency, hematuria and flank pain.   Musculoskeletal: Negative for myalgias, back pain, arthralgias, neck pain and neck stiffness.  Skin: Positive for wound. Negative for rash.  Neurological: Negative for dizziness, syncope, facial asymmetry, weakness, light-headedness, numbness and headaches.  Psychiatric/Behavioral: Negative for confusion.  All other systems reviewed and are negative.     Allergies  Review of patient's allergies indicates no known allergies.  Home Medications   Prior to Admission medications   Medication Sig Start Date End Date Taking? Authorizing Provider  anastrozole (ARIMIDEX) 1 MG tablet Take 1 mg by mouth daily.    Historical Provider, MD  GABAPENTIN, ONCE-DAILY, PO Take by mouth.    Historical Provider, MD  quinapril (ACCUPRIL) 5 MG tablet Take 5 mg by mouth at bedtime.    Historical Provider, MD  Sulfamethoxazole-Trimethoprim (BACTRIM DS PO) Take by mouth.    Historical Provider, MD   BP 120/63 mmHg  Pulse 80  Temp(Src) 98.7 F (37.1 C) (Oral)  Resp 25  Wt 97 lb (43.999 kg)  SpO2 92% Physical Exam  Constitutional: She is oriented to person, place, and time. She appears well-developed and well-nourished. No distress.  HENT:  Head: Normocephalic and atraumatic.  Mouth/Throat: Oropharynx is clear and moist.  Eyes: EOM are normal. Pupils are equal, round, and reactive to light.  Neck: Normal range of motion. Neck supple.  No posterior midline cervical tenderness to palpation.  Cardiovascular: Normal rate and regular rhythm.  Exam reveals no gallop and no friction rub.   No murmur heard. Pulmonary/Chest: Effort normal  and breath sounds normal. No respiratory distress. She has no wheezes. She has no rales. She exhibits no tenderness.  Abdominal: Soft. Bowel sounds are normal. She exhibits no distension and no mass. There is no tenderness. There is no rebound and no guarding.  Musculoskeletal: Normal range of motion. She exhibits no edema or tenderness.  Full range of motion of bilateral  hips, knees and ankles without pain. No lower sugary swelling. Distal pulses are equal and intact.  Neurological: She is alert and oriented to person, place, and time.  Patient is alert and oriented x3 with clear, goal oriented speech. Patient has 5/5 motor in all extremities. Sensation is intact to light touch. Bilateral finger-to-nose is normal with no signs of dysmetria. Patient has a normal gait and walks without assistance. No dizziness with standing  Skin: Skin is warm and dry. No rash noted. No erythema.  Patient has superficial skin tear to the right lower extremity over the pretibial area. Roughly 8 cm in length. No active bleeding. No gross contamination. No underlying bony deformity or tenderness.  Psychiatric: She has a normal mood and affect. Her behavior is normal.  Nursing note and vitals reviewed.   ED Course  Procedures (including critical care time) Labs Review Labs Reviewed  URINALYSIS, ROUTINE W REFLEX MICROSCOPIC (NOT AT [facility]) - Abnormal; Notable for the following:    APPearance TURBID (*)    Leukocytes, UA SMALL (*)    All other components within normal limits  URINE MICROSCOPIC-ADD ON - Abnormal; Notable for the following:    Squamous Epithelial / LPF 6-30 (*)    Bacteria, UA FEW (*)    All other components within normal limits    Imaging Review No results found. I have personally reviewed and evaluated these images and lab results as part of my medical decision-making.   EKG Interpretation   Date/Time:  Friday December 21 2015 16:58:03 EDT Ventricular Rate:  78 PR Interval:  133 QRS Duration: 89 QT Interval:  388 QTC Calculation: 442 R Axis:   74 Text Interpretation:  Sinus rhythm Probable left atrial enlargement Low  voltage, extremity leads Abnormal R-wave progression, late transition  Nonspecific T abnrm, anterolateral leads Baseline wander in lead(s) V2  Confirmed by Presli Fanguy  MD, Jenavi Beedle ([id number]) on 12/21/2015 7:52:58 PM      MDM   Final diagnoses:   Fall from standing, initial encounter  Orthostasis  Skin tear of lower leg without complication, right, initial encounter    Patient with recent CT head, cervical spine and blood work. UA with contamination. Patient case take antibiotics for UTI. Patient did have a drop in her blood pressure with standing. She was not symptomatic with this. Wound was irrigated and Steri-Strips placed. Bacitracin and dry dressing placed. Patient has follow-up with her primary physician. She understands the need to return immediately for any new or changing symptoms. She's been instructed to drink plenty of water and to change positions slowly. She and her daughter have been advised to return immediately for any evidence of infection including redness, swelling, warmth to the wound.   Julianne Rice, MD 12/21/15 [phone]

## 2015-12-24 ENCOUNTER — Encounter (HOSPITAL_COMMUNITY): Payer: Self-pay | Admitting: Emergency Medicine

## 2015-12-24 ENCOUNTER — Ambulatory Visit (HOSPITAL_BASED_OUTPATIENT_CLINIC_OR_DEPARTMENT_OTHER)
Admission: RE | Admit: 2015-12-24 | Discharge: 2015-12-24 | Disposition: A | Discharge status: Home / Self Care | Payer: Medicare PPO | Source: Ambulatory Visit | Attending: Family Medicine | Admitting: Family Medicine

## 2015-12-24 ENCOUNTER — Emergency Department (HOSPITAL_COMMUNITY): Payer: Medicare PPO

## 2015-12-24 ENCOUNTER — Inpatient Hospital Stay (HOSPITAL_BASED_OUTPATIENT_CLINIC_OR_DEPARTMENT_OTHER)
Admission: RE | Admit: 2015-12-24 | Discharge: 2015-12-28 | Disposition: A | Discharge status: Home / Self Care | Payer: Medicare PPO | Source: Ambulatory Visit | Attending: Family Medicine | Admitting: Family Medicine

## 2015-12-24 ENCOUNTER — Telehealth: Payer: Self-pay | Admitting: Family Medicine

## 2015-12-24 ENCOUNTER — Encounter: Payer: Self-pay | Admitting: Family Medicine

## 2015-12-24 ENCOUNTER — Ambulatory Visit (INDEPENDENT_AMBULATORY_CARE_PROVIDER_SITE_OTHER): Payer: Medicare PPO | Admitting: Family Medicine

## 2015-12-24 ENCOUNTER — Other Ambulatory Visit: Payer: Self-pay

## 2015-12-24 VITALS — BP 82/50 | HR 87 | Temp 97.7°F | Ht 62.5 in | Wt 94.4 lb

## 2015-12-24 DIAGNOSIS — R7989 Other specified abnormal findings of blood chemistry: Secondary | ICD-10-CM

## 2015-12-24 DIAGNOSIS — R160 Hepatomegaly, not elsewhere classified: Principal | ICD-10-CM | POA: Insufficient documentation

## 2015-12-24 DIAGNOSIS — I951 Orthostatic hypotension: Secondary | ICD-10-CM

## 2015-12-24 DIAGNOSIS — F05 Delirium due to known physiological condition: Secondary | ICD-10-CM

## 2015-12-24 DIAGNOSIS — I10 Essential (primary) hypertension: Secondary | ICD-10-CM | POA: Diagnosis present

## 2015-12-24 DIAGNOSIS — K5900 Constipation, unspecified: Secondary | ICD-10-CM | POA: Insufficient documentation

## 2015-12-24 DIAGNOSIS — C787 Secondary malignant neoplasm of liver and intrahepatic bile duct: Secondary | ICD-10-CM | POA: Diagnosis not present

## 2015-12-24 DIAGNOSIS — R188 Other ascites: Secondary | ICD-10-CM | POA: Diagnosis not present

## 2015-12-24 DIAGNOSIS — R945 Abnormal results of liver function studies: Secondary | ICD-10-CM

## 2015-12-24 DIAGNOSIS — N839 Noninflammatory disorder of ovary, fallopian tube and broad ligament, unspecified: Secondary | ICD-10-CM | POA: Diagnosis not present

## 2015-12-24 DIAGNOSIS — R627 Adult failure to thrive: Secondary | ICD-10-CM

## 2015-12-24 DIAGNOSIS — Z515 Encounter for palliative care: Secondary | ICD-10-CM | POA: Diagnosis not present

## 2015-12-24 DIAGNOSIS — R0602 Shortness of breath: Secondary | ICD-10-CM

## 2015-12-24 DIAGNOSIS — T5191XA Toxic effect of unspecified alcohol, accidental (unintentional), initial encounter: Secondary | ICD-10-CM

## 2015-12-24 DIAGNOSIS — R531 Weakness: Secondary | ICD-10-CM | POA: Diagnosis not present

## 2015-12-24 DIAGNOSIS — Z7189 Other specified counseling: Secondary | ICD-10-CM | POA: Insufficient documentation

## 2015-12-24 DIAGNOSIS — N838 Other noninflammatory disorders of ovary, fallopian tube and broad ligament: Secondary | ICD-10-CM | POA: Diagnosis present

## 2015-12-24 DIAGNOSIS — R770 Abnormality of albumin: Secondary | ICD-10-CM | POA: Diagnosis not present

## 2015-12-24 DIAGNOSIS — Z853 Personal history of malignant neoplasm of breast: Secondary | ICD-10-CM | POA: Diagnosis not present

## 2015-12-24 DIAGNOSIS — R0600 Dyspnea, unspecified: Secondary | ICD-10-CM | POA: Diagnosis not present

## 2015-12-24 LAB — CBC WITH DIFFERENTIAL/PLATELET
Basophils Absolute: 0 10*3/uL (ref 0.0–0.1)
Basophils Relative: 0 %
EOS ABS: 0 10*3/uL (ref 0.0–0.7)
Eosinophils Relative: 0 %
HEMATOCRIT: 34.5 % — AB (ref 36.0–46.0)
HEMOGLOBIN: 11.3 g/dL — AB (ref 12.0–15.0)
LYMPHS ABS: 1.6 10*3/uL (ref 0.7–4.0)
Lymphocytes Relative: 12 %
MCH: 27.2 pg (ref 26.0–34.0)
MCHC: 32.8 g/dL (ref 30.0–36.0)
MCV: 82.9 fL (ref 78.0–100.0)
MONOS PCT: 13 %
Monocytes Absolute: 1.6 10*3/uL — ABNORMAL HIGH (ref 0.1–1.0)
NEUTROS PCT: 75 %
Neutro Abs: 9.6 10*3/uL — ABNORMAL HIGH (ref 1.7–7.7)
Platelets: 325 10*3/uL (ref 150–400)
RBC: 4.16 MIL/uL (ref 3.87–5.11)
RDW: 14.7 % (ref 11.5–15.5)
WBC: 12.8 10*3/uL — ABNORMAL HIGH (ref 4.0–10.5)

## 2015-12-24 LAB — COMPREHENSIVE METABOLIC PANEL
ALT: 44 U/L (ref 14–54)
ANION GAP: 9 (ref 5–15)
AST: 74 U/L — ABNORMAL HIGH (ref 15–41)
Albumin: 2.8 g/dL — ABNORMAL LOW (ref 3.5–5.0)
Alkaline Phosphatase: 240 U/L — ABNORMAL HIGH (ref 38–126)
BILIRUBIN TOTAL: 0.9 mg/dL (ref 0.3–1.2)
BUN: 30 mg/dL — ABNORMAL HIGH (ref 6–20)
CHLORIDE: 98 mmol/L — AB (ref 101–111)
CO2: 22 mmol/L (ref 22–32)
Calcium: 8.9 mg/dL (ref 8.9–10.3)
Creatinine, Ser: 1.29 mg/dL — ABNORMAL HIGH (ref 0.44–1.00)
GFR, EST AFRICAN AMERICAN: 43 mL/min — AB (ref >60)
GFR, EST NON AFRICAN AMERICAN: 37 mL/min — AB (ref >60)
Glucose, Bld: 119 mg/dL — ABNORMAL HIGH (ref 65–99)
POTASSIUM: 4.4 mmol/L (ref 3.5–5.1)
Sodium: 129 mmol/L — ABNORMAL LOW (ref 135–145)
TOTAL PROTEIN: 6.8 g/dL (ref 6.5–8.1)

## 2015-12-24 LAB — BRAIN NATRIURETIC PEPTIDE: B NATRIURETIC PEPTIDE 5: 90.4 pg/mL (ref 0.0–100.0)

## 2015-12-24 LAB — I-STAT TROPONIN, ED: TROPONIN I, POC: 0 ng/mL (ref 0.00–0.08)

## 2015-12-24 LAB — LIPASE, BLOOD: LIPASE: 43 U/L (ref 11–51)

## 2015-12-24 LAB — AMMONIA: AMMONIA: 35 umol/L (ref 9–35)

## 2015-12-24 LAB — ETHANOL: Alcohol, Ethyl (B): <5 mg/dL (ref <5)

## 2015-12-24 MED ORDER — IOPAMIDOL (ISOVUE-300) INJECTION 61%
75.0000 mL | Freq: Once | INTRAVENOUS | Status: AC | PRN
  Administered 2015-12-24: 75 mL via INTRAVENOUS

## 2015-12-24 MED ORDER — DIATRIZOATE MEGLUMINE & SODIUM 66-10 % PO SOLN
15.0000 mL | Freq: Once | ORAL | Status: AC
  Administered 2015-12-24: 30 mL via ORAL

## 2015-12-24 MED ORDER — SODIUM CHLORIDE 0.9 % IV BOLUS (SEPSIS)
1000.0000 mL | Freq: Once | INTRAVENOUS | Status: AC
  Administered 2015-12-24: 1000 mL via INTRAVENOUS

## 2015-12-24 NOTE — Patient Instructions (Signed)
Please proceed to the [facility] ER for evaluation.  You will likely need admission to the hospital for evaluation and treatment.  Take care!

## 2015-12-24 NOTE — Progress Notes (Signed)
Pre visit review using our clinic tool,if applicable. No additional management support is needed unless otherwise documented below in the visit note.  

## 2015-12-24 NOTE — Telephone Encounter (Signed)
Caller name: [name]  Relationship to patient: daughter Can be reached: [phone]   Reason for call: Pt has had several falls since Thursday and was in ER Friday for a leg wound from a fall. This morning pt seems to have lost control of her bowels. She did go for [place] that was ordered this morning too. Please advise if pt needs to be seen or call to daughter [name].

## 2015-12-24 NOTE — ED Notes (Signed)
Pt placed on bedpan. Pt was unable to give a urine sample.pt sts she dosent feel the urge to urinate.

## 2015-12-24 NOTE — Telephone Encounter (Signed)
Called them back- she has continued to fall over the last week.  They had to visit the ER again. She is continuing to be disoriented.  They will come in to see me today at 3:30.  Discussed abd [place]- it appears that she has liver cancer.

## 2015-12-24 NOTE — ED Notes (Signed)
Patient sent over by Dr. Lorelei Pont with complaints dehydration. Reports abnormal [place] today. States that they found a spot on her liver, concerning for cancer. Also frequent falls.

## 2015-12-24 NOTE — ED Notes (Signed)
Bed: WA15 Expected date:  Expected time:  Means of arrival:  Comments: Triage 1  

## 2015-12-24 NOTE — ED Notes (Signed)
Pt sent by PCP([place] MD) d/t several recent falls and new diagnosis of Liver CA.  PCP is concerned for possible orthostatic hypotension.  Liver CA confirmed by ultrasound.

## 2015-12-24 NOTE — Progress Notes (Signed)
[place] [facility] [address] [place], [place] [address] [phone]  Date:  12/24/2015   Name:  Emily Moore   DOB:  [birth year]   MRN:  [id number]  PCP:  Lamar Blinks, MD    Chief Complaint: Follow-up   History of Present Illness:  Emily Moore is a 82 y.o. very pleasant female patient who presents with the following:  Here today to see me in follow-up Recently moved to this area to be closer to family I met her about a week ago after she had a fall and went to the ER.  Noted to have hepatomegaly and elevated LFTs-  unfortunatley recent US showed likely liver cancer,unsure if primary or mets.   Since then she fell again and hurt her leg 3 days ago.  She was noted to have dirty urine at the ER- already on septra however.  Did not have a urine culture that I can see  She is having some bowel incont for a few days- worse today.   3 BM this am She is eating but "not a whole lot." She is still taking septra which was started in the ER about 10 days ago for possible cellulitis on her legs.    She will have some nausea after eating and she feels better when she burps No vomiting No fainting. Denies feeling dizzy or lightheaded.    Her family has noted some confusion for a couple of years but acutely worse Her son notes that she is less alert and seems more confused over the last couple of months and worse over the last week.   They do not have a walker at home. So far pt has not wanted to use one. Family reports orthostatic hypotension in the ER when she was there 3 days ago- I do not see these numbers in the computer but suspect they are correct about this Wt Readings from Last 3 Encounters:  12/24/15 94 lb 6.4 oz (42.82 kg)  12/21/15 97 lb (43.999 kg)  12/19/15 97 lb 3.2 oz (44.09 kg)     BP Readings from Last 3 Encounters:  12/24/15 82/50  12/21/15 141/72  12/19/15 111/59     Patient Active Problem List   Diagnosis  Date Noted  . Pulmonary emphysema ([place]) 12/19/2015  . Malignant neoplasm of left female breast ([place]) 12/19/2015  . Elevated LFTs 12/19/2015  . Alcohol causing toxic effect 12/19/2015  . Frailty 12/19/2015    Past Medical History  Diagnosis Date  . Cancer ([place])   . Hypertension   . Heart murmur   . Arthritis   . Cataract   . COPD (chronic obstructive pulmonary disease) ([place])   . Depression   . Osteoporosis     Past Surgical History  Procedure Laterality Date  . Breast surgery    . Abdominal hysterectomy    . Eye surgery      cataract    Social History  Substance Use Topics  . Smoking status: Current Every Day Smoker -- 1.00 packs/day    Types: Cigarettes  . Smokeless tobacco: None  . Alcohol Use: Yes    Family History  Problem Relation Age of Onset  . Heart disease Mother   . Cancer Father     No Known Allergies  Medication list has been reviewed and updated.  Current Outpatient Prescriptions on File Prior to Visit  Medication Sig Dispense Refill  . anastrozole (ARIMIDEX) 1 MG tablet Take  1 mg by mouth daily.    Marland Kitchen GABAPENTIN, ONCE-DAILY, PO Take by mouth.    . quinapril (ACCUPRIL) 5 MG tablet Take 5 mg by mouth at bedtime.    . Sulfamethoxazole-Trimethoprim (BACTRIM DS PO) Take by mouth.     No current facility-administered medications on file prior to visit.    Review of Systems:  GEN: WDWN, NAD, Non-toxic, A & O x 3 HEENT: Atraumatic, Normocephalic. Neck supple. No masses, No LAD. Ears and Nose: No external deformity. CV: RRR, No M/G/R. No JVD. No thrill. No extra heart sounds. PULM: CTA B, no wheezes, crackles, rhonchi. No retractions. No resp. distress. No accessory muscle use. ABD: S, NT, ND, +BS. No rebound. No HSM. EXTR: No c/c/e NEURO Normal gait.  PSYCH: Normally interactive. Conversant. Not depressed or anxious appearing.  Calm demeanor.    Physical Examination: Filed Vitals:   12/24/15 1549  BP: 82/50  Pulse: 87  Temp: 97.7 F (36.5  C)   Filed Vitals:   12/24/15 1549  Height: 5' 2.5" (1.588 m)  Weight: 94 lb 6.4 oz (42.82 kg)   Body mass index is 16.98 kg/(m^2). Ideal Body Weight: Weight in (lb) to have BMI = 25: 138.6  GEN: WDWN, NAD, Non-toxic, A & O x 3, very thin.  Looks well but frail HEENT: Atraumatic, Normocephalic. Neck supple. No masses, No LAD. Ears and Nose: No external deformity. CV: RRR, No M/G/R. No JVD. No thrill. No extra heart sounds. PULM: CTA B, no wheezes, crackles, rhonchi. No retractions. No resp. distress. No accessory muscle use. ABD: S, NT, ND, +BS. No rebound. Liver is enlarged, mild distention of belly EXTR: No c/c/e NEURO unsteady gait, needs to be monitored while walking PSYCH: Normally interactive. Conversant. Not depressed or anxious appearing.  Calm demeanor.   Dg Chest 2 View  12/14/2015  CLINICAL DATA:  Shortness of Breath EXAM: CHEST  2 VIEW COMPARISON:  None. FINDINGS: There is mild elevation of the right hemidiaphragm. There is no apparent edema or consolidation. The heart size and pulmonary vascularity are normal. No adenopathy. No bone lesions. IMPRESSION: Elevation right hemidiaphragm.  No edema or consolidation. Electronically Signed   By: Lowella Grip III M.D.   On: 12/14/2015 13:38   Ct Head Wo Contrast  12/17/2015  CLINICAL DATA:  Fall today, hit back of head on concrete. Large hematoma well laceration. EXAM: CT HEAD WITHOUT CONTRAST CT CERVICAL SPINE WITHOUT CONTRAST TECHNIQUE: Multidetector CT imaging of the head and cervical spine was performed following the standard protocol without intravenous contrast. Multiplanar CT image reconstructions of the cervical spine were also generated. COMPARISON:  None. FINDINGS: CT HEAD FINDINGS Posterior scalp hematoma. No underlying calvarial abnormality. Old infarct in the left frontal lobe. There is atrophy and chronic small vessel disease changes. No acute intracranial abnormality. Specifically, no hemorrhage, hydrocephalus, mass  lesion, acute infarction, or significant intracranial injury. No acute calvarial abnormality. Visualized paranasal sinuses and mastoids clear. Orbital soft tissues unremarkable. CT CERVICAL SPINE FINDINGS Normal alignment. Prevertebral soft tissues are normal. Diffuse degenerative disc and facet disease. No fracture. No epidural or paraspinal hematoma. IMPRESSION: No acute intracranial abnormality. Atrophy, chronic microvascular disease. Old left frontal infarct. Cervical spondylosis.  No acute bony abnormality. Electronically Signed   By: Rolm Baptise M.D.   On: 12/17/2015 12:52   Ct Cervical Spine Wo Contrast  12/17/2015  CLINICAL DATA:  Fall today, hit back of head on concrete. Large hematoma well laceration. EXAM: CT HEAD WITHOUT CONTRAST CT CERVICAL SPINE WITHOUT CONTRAST  TECHNIQUE: Multidetector CT imaging of the head and cervical spine was performed following the standard protocol without intravenous contrast. Multiplanar CT image reconstructions of the cervical spine were also generated. COMPARISON:  None. FINDINGS: CT HEAD FINDINGS Posterior scalp hematoma. No underlying calvarial abnormality. Old infarct in the left frontal lobe. There is atrophy and chronic small vessel disease changes. No acute intracranial abnormality. Specifically, no hemorrhage, hydrocephalus, mass lesion, acute infarction, or significant intracranial injury. No acute calvarial abnormality. Visualized paranasal sinuses and mastoids clear. Orbital soft tissues unremarkable. CT CERVICAL SPINE FINDINGS Normal alignment. Prevertebral soft tissues are normal. Diffuse degenerative disc and facet disease. No fracture. No epidural or paraspinal hematoma. IMPRESSION: No acute intracranial abnormality. Atrophy, chronic microvascular disease. Old left frontal infarct. Cervical spondylosis.  No acute bony abnormality. Electronically Signed   By: Rolm Baptise M.D.   On: 12/17/2015 12:52   US Venous Img Lower Bilateral  12/14/2015  CLINICAL  DATA:  Bilateral leg swelling for 2-3 weeks EXAM: BILATERAL LOWER EXTREMITY VENOUS DOPPLER ULTRASOUND TECHNIQUE: Gray-scale sonography with graded compression, as well as color Doppler and duplex ultrasound were performed to evaluate the lower extremity deep venous systems from the level of the common femoral vein and including the common femoral, femoral, profunda femoral, popliteal and calf veins including the posterior tibial, peroneal and gastrocnemius veins when visible. The superficial great saphenous vein was also interrogated. Spectral Doppler was utilized to evaluate flow at rest and with distal augmentation maneuvers in the common femoral, femoral and popliteal veins. COMPARISON:  None. FINDINGS: RIGHT LOWER EXTREMITY Common Femoral Vein: No evidence of thrombus. Normal compressibility, respiratory phasicity and response to augmentation. Saphenofemoral Junction: No evidence of thrombus. Normal compressibility and flow on color Doppler imaging. Profunda Femoral Vein: No evidence of thrombus. Normal compressibility and flow on color Doppler imaging. Femoral Vein: No evidence of thrombus. Normal compressibility, respiratory phasicity and response to augmentation. Popliteal Vein: No evidence of thrombus. Normal compressibility, respiratory phasicity and response to augmentation. Calf Veins: No evidence of thrombus. Normal compressibility and flow on color Doppler imaging. Superficial Great Saphenous Vein: No evidence of thrombus. Normal compressibility and flow on color Doppler imaging. Venous Reflux:  None. Other Findings:  None. LEFT LOWER EXTREMITY Common Femoral Vein: No evidence of thrombus. Normal compressibility, respiratory phasicity and response to augmentation. Saphenofemoral Junction: No evidence of thrombus. Normal compressibility and flow on color Doppler imaging. Profunda Femoral Vein: No evidence of thrombus. Normal compressibility and flow on color Doppler imaging. Femoral Vein: No evidence of  thrombus. Normal compressibility, respiratory phasicity and response to augmentation. Popliteal Vein: No evidence of thrombus. Normal compressibility, respiratory phasicity and response to augmentation. Calf Veins: No evidence of thrombus. Normal compressibility and flow on color Doppler imaging. Superficial Great Saphenous Vein: No evidence of thrombus. Normal compressibility and flow on color Doppler imaging. Venous Reflux:  None. Other Findings:  None. IMPRESSION: No evidence of deep venous thrombosis. Electronically Signed   By: Inez Catalina M.D.   On: 12/14/2015 13:31   US Abdomen Limited Ruq  12/24/2015  CLINICAL DATA:  Elevated liver function tests. Alcohol abuse. Hepatomegaly. Personal history of left breast carcinoma. EXAM: US ABDOMEN LIMITED - RIGHT UPPER QUADRANT COMPARISON:  None. FINDINGS: Gallbladder: No gallstones or wall thickening visualized. No sonographic Murphy sign noted by sonographer. Common bile duct: Diameter: 3 mm, within normal limits. Liver: Liver appears enlarged. There are numerous masses seen throughout both the right and left hepatic lobes which are mildly hyperechoic in echotexture. These masses measure up to 4  cm in size. Differential diagnosis includes diffuse hepatic metastases and multifocal hepatocellular carcinoma in setting of cirrhosis. Minimal perihepatic ascites noted. IMPRESSION: Hepatomegaly with multiple diffuse hepatic masses measuring up to 4 cm and mild perihepatic ascites. Differential diagnosis includes hepatic metastases and multifocal hepatocellular carcinoma in setting of cirrhosis. Abdomen MRI without and with contrast recommended for further evaluation. No evidence of gallstones or biliary dilatation. These results will be called to the ordering clinician or representative by the Radiologist Assistant, and communication documented in the PACS or zVision Dashboard. Electronically Signed   By: Earle Gell M.D.   On: 12/24/2015 08:53    Assessment and  Plan: Acute confusional state  Liver mass  Elevated liver function tests  Orthostatic hypotension  Failure to thrive in adult  Here today with worsening symptoms of confusion and falls. Found to have likely liver cancer on her [place] today- I did discuss this with pt and her son and DIL today so they are aware  Discussed pursing outpatient treatment vs admission- her family does not feel that she is doing ok at home,they are worried that she is acutely worsening and is having multiple falls   Will refer to [facility] ER for evaluation and stabilization, suspect she will need admission to hospital  Signed Lamar Blinks, MD

## 2015-12-24 NOTE — ED Notes (Signed)
Pt is aware we need a urine sample. Pt stated she hasn't had anything to drink all day and is dehydrated. Pt has fluids running and will check back after they are finished. Pt stated though if she feels like she needs to go in the meantime, she will notify.

## 2015-12-24 NOTE — ED Provider Notes (Signed)
CSN: [id number]     Arrival date & time 12/24/15  1725 History   First MD Initiated Contact with Patient 12/24/15 1807     Chief Complaint  Patient presents with  . Abnormal Lab     (Consider location/radiation/quality/duration/timing/severity/associated sxs/prior Treatment) The history is provided by the patient.  Emily Moore is a 82 y.o. female hx of HTN, COPD, breast cancer previously, here with poor appetite, abdominal swelling. Abdominal swelling over the last week or so. Patient states that she has been feeling very weak and has been falling very often. She finished a course of antibiotics about a month ago and then about 2 weeks ago was diagnosed with UTI in the ED and finished a course of Bactrim. Patient has been having poor appetite and not eating very much and has been just weak all over. On to primary care doctor today and had a ultrasound done that showed liver metastatic disease and was noted to be hypotensive 80s in the office so sent here for evaluation.      Past Medical History  Diagnosis Date  . Cancer ([place])   . Hypertension   . Heart murmur   . Arthritis   . Cataract   . COPD (chronic obstructive pulmonary disease) ([place])   . Depression   . Osteoporosis    Past Surgical History  Procedure Laterality Date  . Breast surgery    . Abdominal hysterectomy    . Eye surgery      cataract   Family History  Problem Relation Age of Onset  . Heart disease Mother   . Cancer Father    Social History  Substance Use Topics  . Smoking status: Current Every Day Smoker -- 1.00 packs/day    Types: Cigarettes  . Smokeless tobacco: None  . Alcohol Use: Yes   OB History    No data available     Review of Systems  Gastrointestinal: Positive for nausea and abdominal distention.  All other systems reviewed and are negative.     Allergies  Review of patient's allergies indicates no known allergies.  Home Medications   Prior to Admission medications   Medication Sig  Start Date End Date Taking? Authorizing Provider  anastrozole (ARIMIDEX) 1 MG tablet Take 1 mg by mouth daily.   Yes Historical Provider, MD  gabapentin (NEURONTIN) 300 MG capsule Take 300 mg by mouth daily.   Yes Historical Provider, MD  quinapril (ACCUPRIL) 5 MG tablet Take 5 mg by mouth daily.    Yes Historical Provider, MD  Sulfamethoxazole-Trimethoprim (BACTRIM DS PO) Take 1 tablet by mouth 2 (two) times daily. ABT Start Date 12/12/15 & End Date 12/26/15   Yes Historical Provider, MD   BP 123/68 mmHg  Pulse 87  Temp(Src) 98 F (36.7 C) (Oral)  Resp 22  SpO2 97% Physical Exam  Constitutional: She is oriented to person, place, and time.  Chronically ill appearing   HENT:  Head: Normocephalic.  MM dry   Eyes: Conjunctivae are normal. Pupils are equal, round, and reactive to light.  Neck: Normal range of motion. Neck supple.  Cardiovascular: Normal rate, regular rhythm and normal heart sounds.   Pulmonary/Chest: Effort normal and breath sounds normal. No respiratory distress. She has no wheezes.  Abdominal:  Distended, + fluid wave   Musculoskeletal: Normal range of motion. She exhibits no edema or tenderness.  Neurological: She is alert and oriented to person, place, and time.  Skin: Skin is warm and dry.  Psychiatric: She has a  normal mood and affect. Her behavior is normal. Judgment and thought content normal.  Nursing note and vitals reviewed.   ED Course  Procedures (including critical care time) Labs Review Labs Reviewed  CBC WITH DIFFERENTIAL/PLATELET - Abnormal; Notable for the following:    WBC 12.8 (*)    Hemoglobin 11.3 (*)    HCT 34.5 (*)    Neutro Abs 9.6 (*)    Monocytes Absolute 1.6 (*)    All other components within normal limits  COMPREHENSIVE METABOLIC PANEL - Abnormal; Notable for the following:    Sodium 129 (*)    Chloride 98 (*)    Glucose, Bld 119 (*)    BUN 30 (*)    Creatinine, Ser 1.29 (*)    Albumin 2.8 (*)    AST 74 (*)    Alkaline  Phosphatase 240 (*)    GFR calc non Af Amer 37 (*)    GFR calc Af Amer 43 (*)    All other components within normal limits  LIPASE, BLOOD  AMMONIA  ETHANOL  BRAIN NATRIURETIC PEPTIDE  URINALYSIS, ROUTINE W REFLEX MICROSCOPIC (NOT AT [facility])  I-STAT TROPOININ, ED    Imaging Review Ct Chest W Contrast  12/24/2015  CLINICAL DATA:  Several recent falls. Clinical concern for orthostatics hypotension. Recently diagnosed liver metastases or multifocal hepatocellular carcinoma. History of alcohol abuse and left breast cancer. EXAM: CT CHEST, ABDOMEN, AND PELVIS WITH CONTRAST TECHNIQUE: Multidetector CT imaging of the chest, abdomen and pelvis was performed following the standard protocol during bolus administration of intravenous contrast. CONTRAST:  26m ISOVUE-300 IOPAMIDOL (ISOVUE-300) INJECTION 61% COMPARISON:  Right upper quadrant abdomen ultrasound obtained earlier today. FINDINGS: CT CHEST FINDINGS Mediastinum/Lymph Nodes: Dense atheromatous coronary artery calcifications. No enlarged lymph nodes. No intravascular thrombi visualized. Lungs/Pleura: Small right pleural effusion. 1.1 cm irregular nodule with adjacent pleural tethering in the anterior right lower lobe on image number 43 of series 10. 1.1 cm mildly irregular nodule in the left lower lobe on image number 57 of series 10. Mild right lower lobe atelectasis. The lungs are diffusely hyperexpanded with right lower lobe bullous changes and diffuse peribronchial thickening. Musculoskeletal: Sclerotic lesion in the superior aspect of the T4 vertebral body. Sclerotic lesion in the posterior aspect of the T9 vertebral body. Sclerotic lesions in the T12 vertebral body. Approximately 20% T12 superior endplate compression deformity with sclerosis. No acute fracture lines. No bony retropulsion. Postmastectomy changes on the left. CT ABDOMEN PELVIS FINDINGS Hepatobiliary: Multiple poorly defined liver masses. This is more pronounced and slightly more confluent  in the superior aspect of the liver on the right. Lobulated liver contours. The liver is also enlarged. Mildly prominent lateral segment left lobe of the liver and caudate lobe. Gallbladder Phrygian cap. Pancreas: No mass, inflammatory changes, or other significant abnormality. Spleen: Within normal limits in size and appearance. Adrenals/Urinary Tract: The right kidney is inferiorly displaced by the enlarged liver. Otherwise, normal appearing kidneys, ureters and urinary bladder. No urinary tract calculi or hydronephrosis. Stomach/Bowel: Small sliding hiatal hernia. Multiple diverticula throughout the colon. No small bowel abnormalities. Normal appearing appendix. Vascular/Lymphatic: Dense atheromatous arterial calcifications. No enlarged lymph nodes. Reproductive: Mildly enlarged left ovary. Large multi-septated cystic and solid right ovarian mass. On image number 90 of series 8, this measures 16.2 x 10.0 cm in maximum dimensions. On sagittal image number 76, this measures 12.3 cm in length. Surgically absent uterus. Other: No free peritoneal fluid. Musculoskeletal: Rounded sclerotic focus in the superior, posterior aspect of the L1 vertebral  body. Disc space narrowing, spur formation and discogenic sclerosis at multiple levels. IMPRESSION: 1. 16.2 x 12.3 x 10.0 cm cystic and solid right ovarian mass, highly suspicious for a primary ovarian carcinoma. An ovarian metastasis is less likely. 2. Hepatomegaly with multiple masses throughout the liver. These most likely represent metastases from metastatic breast cancer or metastatic ovarian carcinoma. However, multifocal hepatocellular carcinoma remains a possibility given additional changes suggesting the possibility of cirrhosis of the liver with a history of alcohol abuse. 3. Multiple sclerotic bone metastases, compatible with metastatic breast cancer. 4. Extensive colonic diverticulosis. Electronically Signed   By: Claudie Revering M.D.   On: 12/24/2015 20:44   Ct  Abdomen Pelvis W Contrast  12/24/2015  CLINICAL DATA:  Several recent falls. Clinical concern for orthostatics hypotension. Recently diagnosed liver metastases or multifocal hepatocellular carcinoma. History of alcohol abuse and left breast cancer. EXAM: CT CHEST, ABDOMEN, AND PELVIS WITH CONTRAST TECHNIQUE: Multidetector CT imaging of the chest, abdomen and pelvis was performed following the standard protocol during bolus administration of intravenous contrast. CONTRAST:  93m ISOVUE-300 IOPAMIDOL (ISOVUE-300) INJECTION 61% COMPARISON:  Right upper quadrant abdomen ultrasound obtained earlier today. FINDINGS: CT CHEST FINDINGS Mediastinum/Lymph Nodes: Dense atheromatous coronary artery calcifications. No enlarged lymph nodes. No intravascular thrombi visualized. Lungs/Pleura: Small right pleural effusion. 1.1 cm irregular nodule with adjacent pleural tethering in the anterior right lower lobe on image number 43 of series 10. 1.1 cm mildly irregular nodule in the left lower lobe on image number 57 of series 10. Mild right lower lobe atelectasis. The lungs are diffusely hyperexpanded with right lower lobe bullous changes and diffuse peribronchial thickening. Musculoskeletal: Sclerotic lesion in the superior aspect of the T4 vertebral body. Sclerotic lesion in the posterior aspect of the T9 vertebral body. Sclerotic lesions in the T12 vertebral body. Approximately 20% T12 superior endplate compression deformity with sclerosis. No acute fracture lines. No bony retropulsion. Postmastectomy changes on the left. CT ABDOMEN PELVIS FINDINGS Hepatobiliary: Multiple poorly defined liver masses. This is more pronounced and slightly more confluent in the superior aspect of the liver on the right. Lobulated liver contours. The liver is also enlarged. Mildly prominent lateral segment left lobe of the liver and caudate lobe. Gallbladder Phrygian cap. Pancreas: No mass, inflammatory changes, or other significant abnormality.  Spleen: Within normal limits in size and appearance. Adrenals/Urinary Tract: The right kidney is inferiorly displaced by the enlarged liver. Otherwise, normal appearing kidneys, ureters and urinary bladder. No urinary tract calculi or hydronephrosis. Stomach/Bowel: Small sliding hiatal hernia. Multiple diverticula throughout the colon. No small bowel abnormalities. Normal appearing appendix. Vascular/Lymphatic: Dense atheromatous arterial calcifications. No enlarged lymph nodes. Reproductive: Mildly enlarged left ovary. Large multi-septated cystic and solid right ovarian mass. On image number 90 of series 8, this measures 16.2 x 10.0 cm in maximum dimensions. On sagittal image number 76, this measures 12.3 cm in length. Surgically absent uterus. Other: No free peritoneal fluid. Musculoskeletal: Rounded sclerotic focus in the superior, posterior aspect of the L1 vertebral body. Disc space narrowing, spur formation and discogenic sclerosis at multiple levels. IMPRESSION: 1. 16.2 x 12.3 x 10.0 cm cystic and solid right ovarian mass, highly suspicious for a primary ovarian carcinoma. An ovarian metastasis is less likely. 2. Hepatomegaly with multiple masses throughout the liver. These most likely represent metastases from metastatic breast cancer or metastatic ovarian carcinoma. However, multifocal hepatocellular carcinoma remains a possibility given additional changes suggesting the possibility of cirrhosis of the liver with a history of alcohol abuse. 3. Multiple sclerotic  bone metastases, compatible with metastatic breast cancer. 4. Extensive colonic diverticulosis. Electronically Signed   By: Claudie Revering M.D.   On: 12/24/2015 20:44   US Abdomen Limited Ruq  12/24/2015  CLINICAL DATA:  Elevated liver function tests. Alcohol abuse. Hepatomegaly. Personal history of left breast carcinoma. EXAM: US ABDOMEN LIMITED - RIGHT UPPER QUADRANT COMPARISON:  None. FINDINGS: Gallbladder: No gallstones or wall thickening  visualized. No sonographic Murphy sign noted by sonographer. Common bile duct: Diameter: 3 mm, within normal limits. Liver: Liver appears enlarged. There are numerous masses seen throughout both the right and left hepatic lobes which are mildly hyperechoic in echotexture. These masses measure up to 4 cm in size. Differential diagnosis includes diffuse hepatic metastases and multifocal hepatocellular carcinoma in setting of cirrhosis. Minimal perihepatic ascites noted. IMPRESSION: Hepatomegaly with multiple diffuse hepatic masses measuring up to 4 cm and mild perihepatic ascites. Differential diagnosis includes hepatic metastases and multifocal hepatocellular carcinoma in setting of cirrhosis. Abdomen MRI without and with contrast recommended for further evaluation. No evidence of gallstones or biliary dilatation. These results will be called to the ordering clinician or representative by the Radiologist Assistant, and communication documented in the PACS or zVision Dashboard. Electronically Signed   By: Earle Gell M.D.   On: 12/24/2015 08:53   I have personally reviewed and evaluated these images and lab results as part of my medical decision-making.   EKG Interpretation None      MDM   Final diagnoses:  None   Doniqua Saxby is a 82 y.o. female here with abdominal distention, weakness, hypotension. Concerned for recurrent cancer and dehydration. Will get labs, CT ab/pel, CT chest.   9:26 PM Patient orthostatic, dropped BP to the 80s. Given IVF. CT showed ovarian cancer with mets to liver and mets to bone and possible breast cancer recurrence. Na 129, BUn 30 consistent with dehydration. ALk Phos 240 likely from bony mets. Will admit.      Wandra Arthurs, MD 12/24/15 2127

## 2015-12-25 ENCOUNTER — Observation Stay (HOSPITAL_BASED_OUTPATIENT_CLINIC_OR_DEPARTMENT_OTHER): Payer: Medicare PPO

## 2015-12-25 ENCOUNTER — Encounter: Payer: Self-pay | Admitting: Family Medicine

## 2015-12-25 ENCOUNTER — Encounter (HOSPITAL_COMMUNITY): Payer: Self-pay | Admitting: *Deleted

## 2015-12-25 DIAGNOSIS — I951 Orthostatic hypotension: Secondary | ICD-10-CM

## 2015-12-25 DIAGNOSIS — R0600 Dyspnea, unspecified: Secondary | ICD-10-CM

## 2015-12-25 DIAGNOSIS — C787 Secondary malignant neoplasm of liver and intrahepatic bile duct: Secondary | ICD-10-CM

## 2015-12-25 DIAGNOSIS — I10 Essential (primary) hypertension: Secondary | ICD-10-CM | POA: Diagnosis present

## 2015-12-25 LAB — HEPATIC FUNCTION PANEL
ALBUMIN: 2.5 g/dL — AB (ref 3.5–5.0)
ALT: 35 U/L (ref 14–54)
AST: 59 U/L — ABNORMAL HIGH (ref 15–41)
Alkaline Phosphatase: 213 U/L — ABNORMAL HIGH (ref 38–126)
BILIRUBIN INDIRECT: 0.4 mg/dL (ref 0.3–0.9)
BILIRUBIN TOTAL: 0.9 mg/dL (ref 0.3–1.2)
Bilirubin, Direct: 0.5 mg/dL (ref 0.1–0.5)
Total Protein: 6 g/dL — ABNORMAL LOW (ref 6.5–8.1)

## 2015-12-25 LAB — CBC
HEMATOCRIT: 32.2 % — AB (ref 36.0–46.0)
Hemoglobin: 10.9 g/dL — ABNORMAL LOW (ref 12.0–15.0)
MCH: 27.9 pg (ref 26.0–34.0)
MCHC: 33.9 g/dL (ref 30.0–36.0)
MCV: 82.4 fL (ref 78.0–100.0)
PLATELETS: 347 10*3/uL (ref 150–400)
RBC: 3.91 MIL/uL (ref 3.87–5.11)
RDW: 14.8 % (ref 11.5–15.5)
WBC: 11.2 10*3/uL — AB (ref 4.0–10.5)

## 2015-12-25 LAB — URINALYSIS, ROUTINE W REFLEX MICROSCOPIC
BILIRUBIN URINE: NEGATIVE
GLUCOSE, UA: NEGATIVE mg/dL
HGB URINE DIPSTICK: NEGATIVE
KETONES UR: NEGATIVE mg/dL
Leukocytes, UA: NEGATIVE
Nitrite: NEGATIVE
PH: 5.5 (ref 5.0–8.0)
Protein, ur: NEGATIVE mg/dL
Specific Gravity, Urine: 1.046 — ABNORMAL HIGH (ref 1.005–1.030)

## 2015-12-25 LAB — SODIUM, URINE, RANDOM: Sodium, Ur: 35 mmol/L

## 2015-12-25 LAB — CREATININE, SERUM
Creatinine, Ser: 0.81 mg/dL (ref 0.44–1.00)
GFR calc Af Amer: >60 mL/min (ref >60)
GFR calc non Af Amer: >60 mL/min (ref >60)

## 2015-12-25 LAB — ECHOCARDIOGRAM COMPLETE
HEIGHTINCHES: 62.5 in
WEIGHTICAEL: 1520 [oz_av]

## 2015-12-25 MED ORDER — ENOXAPARIN SODIUM 30 MG/0.3ML ~~LOC~~ SOLN
30.0000 mg | Freq: Every day | SUBCUTANEOUS | Status: DC
  Administered 2015-12-25 (×2): 30 mg via SUBCUTANEOUS
  Filled 2015-12-25 (×2): qty 0.3

## 2015-12-25 MED ORDER — ACETAMINOPHEN 650 MG RE SUPP: 650.0000 mg | Freq: Four times a day (QID) | RECTAL | Status: DC | PRN

## 2015-12-25 MED ORDER — ENSURE ENLIVE PO LIQD
237.0000 mL | ORAL | Status: DC
  Administered 2015-12-25 – 2015-12-27 (×2): 237 mL via ORAL

## 2015-12-25 MED ORDER — FOLIC ACID 1 MG PO TABS
1.0000 mg | ORAL_TABLET | Freq: Every day | ORAL | Status: DC
  Administered 2015-12-25 – 2015-12-26 (×2): 1 mg via ORAL
  Filled 2015-12-25 (×2): qty 1

## 2015-12-25 MED ORDER — LORAZEPAM 1 MG PO TABS: 1.0000 mg | ORAL_TABLET | Freq: Four times a day (QID) | ORAL | Status: AC | PRN

## 2015-12-25 MED ORDER — GABAPENTIN 300 MG PO CAPS
300.0000 mg | ORAL_CAPSULE | Freq: Every day | ORAL | Status: DC
  Administered 2015-12-25 – 2015-12-28 (×4): 300 mg via ORAL
  Filled 2015-12-25 (×4): qty 1

## 2015-12-25 MED ORDER — MORPHINE SULFATE (PF) 4 MG/ML IV SOLN
4.0000 mg | Freq: Once | INTRAVENOUS | Status: AC
  Administered 2015-12-25: 4 mg via INTRAVENOUS
  Filled 2015-12-25: qty 1

## 2015-12-25 MED ORDER — ANASTROZOLE 1 MG PO TABS
1.0000 mg | ORAL_TABLET | Freq: Every day | ORAL | Status: DC
  Administered 2015-12-25 – 2015-12-26 (×2): 1 mg via ORAL
  Filled 2015-12-25 (×3): qty 1

## 2015-12-25 MED ORDER — HYDRALAZINE HCL 20 MG/ML IJ SOLN: 10.0000 mg | INTRAMUSCULAR | Status: DC | PRN

## 2015-12-25 MED ORDER — ADULT MULTIVITAMIN W/MINERALS CH
1.0000 | ORAL_TABLET | Freq: Every day | ORAL | Status: DC
  Administered 2015-12-25 – 2015-12-26 (×2): 1 via ORAL
  Filled 2015-12-25 (×2): qty 1

## 2015-12-25 MED ORDER — ONDANSETRON HCL 4 MG/2ML IJ SOLN: 4.0000 mg | Freq: Four times a day (QID) | INTRAMUSCULAR | Status: DC | PRN

## 2015-12-25 MED ORDER — THIAMINE HCL 100 MG/ML IJ SOLN: 100.0000 mg | Freq: Every day | INTRAMUSCULAR | Status: DC

## 2015-12-25 MED ORDER — LISINOPRIL 10 MG PO TABS: 5.0000 mg | ORAL_TABLET | Freq: Every day | ORAL | Status: DC

## 2015-12-25 MED ORDER — ONDANSETRON HCL 4 MG PO TABS: 4.0000 mg | ORAL_TABLET | Freq: Four times a day (QID) | ORAL | Status: DC | PRN

## 2015-12-25 MED ORDER — VITAMIN B-1 100 MG PO TABS
100.0000 mg | ORAL_TABLET | Freq: Every day | ORAL | Status: DC
  Administered 2015-12-25 – 2015-12-26 (×2): 100 mg via ORAL
  Filled 2015-12-25 (×2): qty 1

## 2015-12-25 MED ORDER — SODIUM CHLORIDE 0.9 % IV SOLN
INTRAVENOUS | Status: AC
  Administered 2015-12-25 (×3): via INTRAVENOUS

## 2015-12-25 MED ORDER — LORAZEPAM 2 MG/ML IJ SOLN: 1.0000 mg | Freq: Four times a day (QID) | INTRAMUSCULAR | Status: AC | PRN

## 2015-12-25 MED ORDER — ACETAMINOPHEN 325 MG PO TABS: 650.0000 mg | ORAL_TABLET | Freq: Four times a day (QID) | ORAL | Status: DC | PRN

## 2015-12-25 MED ORDER — SODIUM CHLORIDE 0.9 % IV SOLN
INTRAVENOUS | Status: DC
  Administered 2015-12-25: 01:00:00 via INTRAVENOUS

## 2015-12-25 NOTE — Progress Notes (Signed)
Initial Nutrition Assessment  DOCUMENTATION CODES:   Severe malnutrition in context of acute illness/injury, Underweight  INTERVENTION:   Provide Ensure Enlive po daily, each supplement provides 350 kcal and 20 grams of protein RD to continue to monitor  NUTRITION DIAGNOSIS:   Malnutrition related to acute illness as evidenced by percent weight loss, severe depletion of body fat, severe depletion of muscle mass.  GOAL:   Patient will meet greater than or equal to 90% of their needs  MONITOR:   PO intake, Supplement acceptance, Labs, Weight trends, Skin, I & O's  REASON FOR ASSESSMENT:   Malnutrition Screening Tool    ASSESSMENT:   82 y.o. female with medical history significant of breast cancer status post mastectomy present on anastrozole, hypertension, abuse who has recently moved to her son's house from [place] was brought to the ER after patient was found to be increasingly weak and sonogram shows liver mass. Patient was found to have elevated LFTs. Sonogram was done. Patient also has been in a week and has had falls and had come to the ER a week ago and at that time CT head did not show anything acute. As per the patient's daughter over the last 3 days patient has been having recurrent episodes of diarrhea. Patient has been on glucose of antibiotics for lower extremity swelling, possible cellulitis and also UTI.  Patient reports an "average" appetite, stating there has been very little changes in her eating habits. She does endorse some nausea, abdominal pain and diarrhea PTA, denies vomiting. Pt normally eats breakfast and dinner at home. This morning she consumed 100% of her breakfast of omelette with peppers, english muffin, cranberry juice and coffee.  Per weight history, pt has lost 4 lb since 4/24 (4% wt loss x 1 week, significant for time frame). Pt reports UBW of 100 lb, states she has stayed around 100 lb since her husband died 59 years ago. Nutrition-Focused  physical exam completed. Findings are severe fat depletion, severe muscle depletion, and no edema.  Pt is willing to try Ensure supplements, once daily at lunchtime as she isn't a big lunch eater. RD to order.  Labs reviewed. Medications: folic acid tablet daily, Multivitamin with minerals daily, Thiamine tablet daily  Diet Order:  Diet Heart Room service appropriate?: Yes; Fluid consistency:: Thin  Skin:  Wound (see comment) (head wound)  Last BM:  5/1  Height:   Ht Readings from Last 1 Encounters:  12/25/15 5' 2.5" (1.588 m)    Weight:   Wt Readings from Last 1 Encounters:  12/25/15 95 lb (43.092 kg)    Ideal Body Weight:  52.3 kg  BMI:  Body mass index is 17.09 kg/(m^2).  Estimated Nutritional Needs:   Kcal:  1300-1500  Protein:  60-70g  Fluid:  1.5L/day  EDUCATION NEEDS:   No education needs identified at this time  Clayton Bibles, MS, RD, LDN Pager: [phone] After Hours Pager: [phone]

## 2015-12-25 NOTE — Progress Notes (Signed)
Lovenox per Pharmacy for DVT Prophylaxis    Pharmacy has been consulted from dosing enoxaparin (lovenox) in this patient for DVT prophylaxis.  The pharmacist has reviewed pertinent labs (Hgb 11.3___; PLT_325__), patient weight (_43__kg) and renal function (CrCl_22__mL/min) and decided that enoxaparin _30_mg SQ Q 24__Hrs is appropriate for this patient.  The pharmacy department will sign off at this time.  Please reconsult pharmacy if status changes or for further issues.  Thank you  Cyndia Diver PharmD, BCPS  12/25/2015, 1:10 AM

## 2015-12-25 NOTE — Evaluation (Signed)
Physical Therapy Evaluation Patient Details Name: Emily Moore MRN: [id number] DOB: [birth year] Today's Date: 12/25/2015   History of Present Illness  82 yo female admitted with orthostatic hypotension, CT+ mets to liver, bone. Hx of breast cancer, osteoporosis, HTN, COPD, arthritis, depression.  Clinical Impression  On eval, pt required Min assist for mobility-walked ~75 feet with RW. Pt is unsteady. Daughter present during session-states family can provide assistance if needed. She states they will continue to make decisions about d/c plan depending on hospital course. Will follow and progress activity as tolerated.     Follow Up Recommendations Home health PT;Supervision/Assistance - 24 hour    Equipment Recommendations  Rolling walker with 5" wheels (youth)    Recommendations for Other Services       Precautions / Restrictions Precautions Precautions: Fall Restrictions Weight Bearing Restrictions: No      Mobility  Bed Mobility Overal bed mobility: Needs Assistance Bed Mobility: Supine to Sit     Supine to sit: HOB elevated;Min guard     General bed mobility comments: close guard for safety  Transfers Overall transfer level: Needs assistance Equipment used: Rolling walker (2 wheeled) Transfers: Sit to/from Stand Sit to Stand: Min guard         General transfer comment: close guard for safety. VCS hand placement  Ambulation/Gait Ambulation/Gait assistance: Min assist Ambulation Distance (Feet): 75 Feet Assistive device: Rolling walker (2 wheeled) Gait Pattern/deviations: Step-through pattern;Decreased stride length;Trunk flexed     General Gait Details: Intermittent assist to steady especially with head turns while walking. Pt tolerated distance well.   Stairs            Wheelchair Mobility    Modified Rankin (Stroke Patients Only)       Balance Overall balance assessment: Needs assistance;History of Falls           Standing balance-Leahy  Scale: Poor                               Pertinent Vitals/Pain Pain Assessment: No/denies pain    Home Living Family/patient expects to be discharged to:: Private residence Living Arrangements: Children (Living with children for last few weeks prior to admission) Available Help at Discharge: Family Type of Home: House Home Access: Stairs to enter Entrance Stairs-Rails: None Technical brewer of Steps: 3 Home Layout: One level Home Equipment: Grab bars - tub/shower      Prior Function Level of Independence: Independent               Hand Dominance        Extremity/Trunk Assessment   Upper Extremity Assessment: Generalized weakness           Lower Extremity Assessment: Generalized weakness      Cervical / Trunk Assessment: Kyphotic  Communication   Communication: No difficulties  Cognition Arousal/Alertness: Awake/alert Behavior During Therapy: WFL for tasks assessed/performed Overall Cognitive Status: Within Functional Limits for tasks assessed                      General Comments      Exercises        Assessment/Plan    PT Assessment Patient needs continued PT services  PT Diagnosis Difficulty walking;Generalized weakness   PT Problem List Decreased strength;Decreased activity tolerance;Decreased balance;Decreased mobility;Pain;Decreased skin integrity;Decreased knowledge of use of DME  PT Treatment Interventions DME instruction;Gait training;Functional mobility training;Therapeutic activities;Patient/family education;Therapeutic exercise;Balance training   PT Goals (Current  goals can be found in the Care Plan section) Acute Rehab PT Goals Patient Stated Goal: none stated PT Goal Formulation: With patient/family Time For Goal Achievement: 01/08/16 Potential to Achieve Goals: Good    Frequency Min 3X/week   Barriers to discharge        Co-evaluation               End of Session Equipment Utilized During  Treatment: Gait belt Activity Tolerance: Patient tolerated treatment well Patient left: in chair;with call bell/phone within reach;with chair alarm set;with family/visitor present      Functional Assessment Tool Used: clinical judgement Functional Limitation: Mobility: Walking and moving around Mobility: Walking and Moving Around Current Status [phone]): At least 1 percent but less than 20 percent impaired, limited or restricted Mobility: Walking and Moving Around Goal Status [phone]): At least 1 percent but less than 20 percent impaired, limited or restricted    Time: [id number] PT Time Calculation (min) (ACUTE ONLY): 20 min   Charges:   PT Evaluation $PT Eval Low Complexity: 1 Procedure     PT G Codes:   PT G-Codes **NOT FOR INPATIENT CLASS** Functional Assessment Tool Used: clinical judgement Functional Limitation: Mobility: Walking and moving around Mobility: Walking and Moving Around Current Status [id number]): At least 1 percent but less than 20 percent impaired, limited or restricted Mobility: Walking and Moving Around Goal Status [phone]): At least 1 percent but less than 20 percent impaired, limited or restricted    Weston Anna, MPT Pager: [phone]

## 2015-12-25 NOTE — Progress Notes (Signed)
PROGRESS NOTE    Emily Moore  [id number] DOB: [birth year] DOA: 12/24/2015 PCP: Lamar Blinks, MD  Outpatient Specialists:   Brief Narrative:  On 12/25/2015 by Dr. Gean Birchwood Emily Moore is a 82 y.o. female with medical history significant of breast cancer status post mastectomy present on anastrozole, hypertension, abuse who has recently moved to her son's house from [place] was brought to the ER after patient was found to be increasingly weak and sonogram shows liver mass. Patient was found to have elevated LFTs. Sonogram was done. Patient also has been in a week and has had falls and had come to the ER a week ago and at that time CT head did not show anything acute. As per the patient's daughter over the last 3 days patient has been having recurrent episodes of diarrhea. Patient has been on glucose of antibiotics for lower extremity swelling, possible cellulitis and also UTI. Denies any nausea vomiting but did feel abdominal distention for which patient had CT scan of the chest and abdomen which at this time shows metastatic lesions to the liver and bone probably from breast cancer or new ovarian mass concerning for ovarian carcinoma. Patient has been admitted for further management.  Assessment & Plan   Orthostatic hypotension -Patient found to be hypotensive in the emergency department -Continue IV hydration -Likely secondary to dehydration from diarrhea and poor oral intake  Dyspnea -Secondary to unknown cause -Echocardiogram pending -CT chest with contrast: Small right crural effusion, mild right lower lobe atelectasis, lungs are diffusely hyperexpanded. 1.1 cm irregular nodule with adjacent pleural tethering in the right lower lobe. -will continue to monitor  Diarrhea -Patient has not had any further loose bowel movements. She actually had a formed stool today. -Cannot some sample for C. Difficile. -If diarrhea occurs again, will obtain sample  Metastatic lesions to liver  and bone concerning for breast cancer or ovarian cancer -Oncology consulted and appreciated -Pending further recommendations and testing  Acute renal injury -Likely secondary to dehydration from diarrhea as well as poor oral intake -ACE inhibitor held -Creatinine was 1.29 upon admission, currently 0.81 -Continue to monitor BMP  Alcohol abuse -Currently not having withdrawal symptoms or DTs -Continue CIWA protocol  Normocytic Anemia -Appears to be stable, continue to monitor CBC  Essential hypertension -Medications held, continue hydralazine as needed  DVT Prophylaxis  Lovenxo  Code Status: DNR  Family Communication: Family at bedside  Disposition Plan: Admitted. Pending workup and further recommendations from oncology   Consultants Oncology  Procedures  Echocardiogram  Antibiotics   Anti-infectives    None      Subjective:   Emily Moore seen and examined today. Patient states she is feeling okay. Denies any chest pain or shortness of breath at this time. Denies any abdominal pain, nausea or vomiting. Does endorse that she has a good appetite. Family at bedside states she has lost several pounds in the last week.  Objective:   Filed Vitals:   12/24/15 2230 12/25/15 0001 12/25/15 0044 12/25/15 0554  BP: 113/64 113/65 117/59 129/63  Pulse: 78 82 83 83  Temp:  98.6 F (37 C) 98.3 F (36.8 C) 98.4 F (36.9 C)  TempSrc:  Oral Oral Oral  Resp: 22 19 20 20   Height:   5' 2.5" (1.588 m)   Weight:   43.092 kg (95 lb)   SpO2: 92% 93% 93% 92%    Intake/Output Summary (Last 24 hours) at 12/25/15 1335 Last data filed at 12/25/15 0600  Gross per 24 hour  Intake 346.25 ml  Output    500 ml  Net -153.75 ml   Filed Weights   12/25/15 0044  Weight: 43.092 kg (95 lb)    Exam  General: Well developed, well nourished, NAD, appears stated age  HEENT: NCAT,  mucous membranes dry  Cardiovascular: S1 S2 auscultated, 2/6SEM, RRR  Respiratory: Clear to auscultation  bilaterally   Abdomen: Soft, nontender, nondistended, + bowel sounds  Extremities: warm dry without cyanosis clubbing or edema. RLE wrapped.  Neuro: AAOx3, nonfocal  Psych: Normal affect and demeanor    Data Reviewed: I have personally reviewed following labs and imaging studies  CBC:  Recent Labs Lab 12/24/15 1812 12/25/15 0502  WBC 12.8* 11.2*  NEUTROABS 9.6*  --   HGB 11.3* 10.9*  HCT 34.5* 32.2*  MCV 82.9 82.4  PLT 325 [id number]   Basic Metabolic Panel:  Recent Labs Lab 12/24/15 1812 12/25/15 0502  NA 129*  --   K 4.4  --   CL 98*  --   CO2 22  --   GLUCOSE 119*  --   BUN 30*  --   CREATININE 1.29* 0.81  CALCIUM 8.9  --    GFR: Estimated Creatinine Clearance: 35.2 mL/min (by C-G formula based on Cr of 0.81). Liver Function Tests:  Recent Labs Lab 12/24/15 1812 12/25/15 0502  AST 74* 59*  ALT 44 35  ALKPHOS 240* 213*  BILITOT 0.9 0.9  PROT 6.8 6.0*  ALBUMIN 2.8* 2.5*    Recent Labs Lab 12/24/15 1812  LIPASE 43    Recent Labs Lab 12/24/15 1812  AMMONIA 35   Coagulation Profile: No results for input(s): INR, PROTIME in the last 168 hours. Cardiac Enzymes: No results for input(s): CKTOTAL, CKMB, CKMBINDEX, TROPONINI in the last 168 hours. BNP (last 3 results) No results for input(s): PROBNP in the last 8760 hours. HbA1C: No results for input(s): HGBA1C in the last 72 hours. CBG: No results for input(s): GLUCAP in the last 168 hours. Lipid Profile: No results for input(s): CHOL, HDL, LDLCALC, TRIG, CHOLHDL, LDLDIRECT in the last 72 hours. Thyroid Function Tests: No results for input(s): TSH, T4TOTAL, FREET4, T3FREE, THYROIDAB in the last 72 hours. Anemia Panel: No results for input(s): VITAMINB12, FOLATE, FERRITIN, TIBC, IRON, RETICCTPCT in the last 72 hours. Urine analysis:    Component Value Date/Time   COLORURINE YELLOW 12/24/2015 2350   APPEARANCEUR CLEAR 12/24/2015 2350   LABSPEC 1.046* 12/24/2015 2350   PHURINE 5.5 12/24/2015  2350   GLUCOSEU NEGATIVE 12/24/2015 2350   HGBUR NEGATIVE 12/24/2015 2350   BILIRUBINUR NEGATIVE 12/24/2015 2350   KETONESUR NEGATIVE 12/24/2015 2350   PROTEINUR NEGATIVE 12/24/2015 2350   NITRITE NEGATIVE 12/24/2015 2350   LEUKOCYTESUR NEGATIVE 12/24/2015 2350   Sepsis Labs: @LABRCNTIP (procalcitonin:4,lacticidven:4)  )No results found for this or any previous visit (from the past 240 hour(s)).    Radiology Studies: Ct Chest W Contrast  12/24/2015  CLINICAL DATA:  Several recent falls. Clinical concern for orthostatics hypotension. Recently diagnosed liver metastases or multifocal hepatocellular carcinoma. History of alcohol abuse and left breast cancer. EXAM: CT CHEST, ABDOMEN, AND PELVIS WITH CONTRAST TECHNIQUE: Multidetector CT imaging of the chest, abdomen and pelvis was performed following the standard protocol during bolus administration of intravenous contrast. CONTRAST:  24mL ISOVUE-300 IOPAMIDOL (ISOVUE-300) INJECTION 61% COMPARISON:  Right upper quadrant abdomen ultrasound obtained earlier today. FINDINGS: CT CHEST FINDINGS Mediastinum/Lymph Nodes: Dense atheromatous coronary artery calcifications. No enlarged lymph nodes. No intravascular thrombi visualized. Lungs/Pleura: Small right pleural effusion. 1.1 cm  irregular nodule with adjacent pleural tethering in the anterior right lower lobe on image number 43 of series 10. 1.1 cm mildly irregular nodule in the left lower lobe on image number 57 of series 10. Mild right lower lobe atelectasis. The lungs are diffusely hyperexpanded with right lower lobe bullous changes and diffuse peribronchial thickening. Musculoskeletal: Sclerotic lesion in the superior aspect of the T4 vertebral body. Sclerotic lesion in the posterior aspect of the T9 vertebral body. Sclerotic lesions in the T12 vertebral body. Approximately 20% T12 superior endplate compression deformity with sclerosis. No acute fracture lines. No bony retropulsion. Postmastectomy changes  on the left. CT ABDOMEN PELVIS FINDINGS Hepatobiliary: Multiple poorly defined liver masses. This is more pronounced and slightly more confluent in the superior aspect of the liver on the right. Lobulated liver contours. The liver is also enlarged. Mildly prominent lateral segment left lobe of the liver and caudate lobe. Gallbladder Phrygian cap. Pancreas: No mass, inflammatory changes, or other significant abnormality. Spleen: Within normal limits in size and appearance. Adrenals/Urinary Tract: The right kidney is inferiorly displaced by the enlarged liver. Otherwise, normal appearing kidneys, ureters and urinary bladder. No urinary tract calculi or hydronephrosis. Stomach/Bowel: Small sliding hiatal hernia. Multiple diverticula throughout the colon. No small bowel abnormalities. Normal appearing appendix. Vascular/Lymphatic: Dense atheromatous arterial calcifications. No enlarged lymph nodes. Reproductive: Mildly enlarged left ovary. Large multi-septated cystic and solid right ovarian mass. On image number 90 of series 8, this measures 16.2 x 10.0 cm in maximum dimensions. On sagittal image number 76, this measures 12.3 cm in length. Surgically absent uterus. Other: No free peritoneal fluid. Musculoskeletal: Rounded sclerotic focus in the superior, posterior aspect of the L1 vertebral body. Disc space narrowing, spur formation and discogenic sclerosis at multiple levels. IMPRESSION: 1. 16.2 x 12.3 x 10.0 cm cystic and solid right ovarian mass, highly suspicious for a primary ovarian carcinoma. An ovarian metastasis is less likely. 2. Hepatomegaly with multiple masses throughout the liver. These most likely represent metastases from metastatic breast cancer or metastatic ovarian carcinoma. However, multifocal hepatocellular carcinoma remains a possibility given additional changes suggesting the possibility of cirrhosis of the liver with a history of alcohol abuse. 3. Multiple sclerotic bone metastases, compatible  with metastatic breast cancer. 4. Extensive colonic diverticulosis. Electronically Signed   By: Claudie Revering M.D.   On: 12/24/2015 20:44   Ct Abdomen Pelvis W Contrast  12/24/2015  CLINICAL DATA:  Several recent falls. Clinical concern for orthostatics hypotension. Recently diagnosed liver metastases or multifocal hepatocellular carcinoma. History of alcohol abuse and left breast cancer. EXAM: CT CHEST, ABDOMEN, AND PELVIS WITH CONTRAST TECHNIQUE: Multidetector CT imaging of the chest, abdomen and pelvis was performed following the standard protocol during bolus administration of intravenous contrast. CONTRAST:  80mL ISOVUE-300 IOPAMIDOL (ISOVUE-300) INJECTION 61% COMPARISON:  Right upper quadrant abdomen ultrasound obtained earlier today. FINDINGS: CT CHEST FINDINGS Mediastinum/Lymph Nodes: Dense atheromatous coronary artery calcifications. No enlarged lymph nodes. No intravascular thrombi visualized. Lungs/Pleura: Small right pleural effusion. 1.1 cm irregular nodule with adjacent pleural tethering in the anterior right lower lobe on image number 43 of series 10. 1.1 cm mildly irregular nodule in the left lower lobe on image number 57 of series 10. Mild right lower lobe atelectasis. The lungs are diffusely hyperexpanded with right lower lobe bullous changes and diffuse peribronchial thickening. Musculoskeletal: Sclerotic lesion in the superior aspect of the T4 vertebral body. Sclerotic lesion in the posterior aspect of the T9 vertebral body. Sclerotic lesions in the T12 vertebral body. Approximately  20% T12 superior endplate compression deformity with sclerosis. No acute fracture lines. No bony retropulsion. Postmastectomy changes on the left. CT ABDOMEN PELVIS FINDINGS Hepatobiliary: Multiple poorly defined liver masses. This is more pronounced and slightly more confluent in the superior aspect of the liver on the right. Lobulated liver contours. The liver is also enlarged. Mildly prominent lateral segment left  lobe of the liver and caudate lobe. Gallbladder Phrygian cap. Pancreas: No mass, inflammatory changes, or other significant abnormality. Spleen: Within normal limits in size and appearance. Adrenals/Urinary Tract: The right kidney is inferiorly displaced by the enlarged liver. Otherwise, normal appearing kidneys, ureters and urinary bladder. No urinary tract calculi or hydronephrosis. Stomach/Bowel: Small sliding hiatal hernia. Multiple diverticula throughout the colon. No small bowel abnormalities. Normal appearing appendix. Vascular/Lymphatic: Dense atheromatous arterial calcifications. No enlarged lymph nodes. Reproductive: Mildly enlarged left ovary. Large multi-septated cystic and solid right ovarian mass. On image number 90 of series 8, this measures 16.2 x 10.0 cm in maximum dimensions. On sagittal image number 76, this measures 12.3 cm in length. Surgically absent uterus. Other: No free peritoneal fluid. Musculoskeletal: Rounded sclerotic focus in the superior, posterior aspect of the L1 vertebral body. Disc space narrowing, spur formation and discogenic sclerosis at multiple levels. IMPRESSION: 1. 16.2 x 12.3 x 10.0 cm cystic and solid right ovarian mass, highly suspicious for a primary ovarian carcinoma. An ovarian metastasis is less likely. 2. Hepatomegaly with multiple masses throughout the liver. These most likely represent metastases from metastatic breast cancer or metastatic ovarian carcinoma. However, multifocal hepatocellular carcinoma remains a possibility given additional changes suggesting the possibility of cirrhosis of the liver with a history of alcohol abuse. 3. Multiple sclerotic bone metastases, compatible with metastatic breast cancer. 4. Extensive colonic diverticulosis. Electronically Signed   By: Claudie Revering M.D.   On: 12/24/2015 20:44   US Abdomen Limited Ruq  12/24/2015  CLINICAL DATA:  Elevated liver function tests. Alcohol abuse. Hepatomegaly. Personal history of left breast  carcinoma. EXAM: US ABDOMEN LIMITED - RIGHT UPPER QUADRANT COMPARISON:  None. FINDINGS: Gallbladder: No gallstones or wall thickening visualized. No sonographic Murphy sign noted by sonographer. Common bile duct: Diameter: 3 mm, within normal limits. Liver: Liver appears enlarged. There are numerous masses seen throughout both the right and left hepatic lobes which are mildly hyperechoic in echotexture. These masses measure up to 4 cm in size. Differential diagnosis includes diffuse hepatic metastases and multifocal hepatocellular carcinoma in setting of cirrhosis. Minimal perihepatic ascites noted. IMPRESSION: Hepatomegaly with multiple diffuse hepatic masses measuring up to 4 cm and mild perihepatic ascites. Differential diagnosis includes hepatic metastases and multifocal hepatocellular carcinoma in setting of cirrhosis. Abdomen MRI without and with contrast recommended for further evaluation. No evidence of gallstones or biliary dilatation. These results will be called to the ordering clinician or representative by the Radiologist Assistant, and communication documented in the PACS or zVision Dashboard. Electronically Signed   By: Earle Gell M.D.   On: 12/24/2015 08:53     Scheduled Meds: . anastrozole  1 mg Oral Daily  . enoxaparin (LOVENOX) injection  30 mg Subcutaneous QHS  . feeding supplement (ENSURE ENLIVE)  237 mL Oral Q24H  . folic acid  1 mg Oral Daily  . gabapentin  300 mg Oral Daily  . multivitamin with minerals  1 tablet Oral Daily  . thiamine  100 mg Oral Daily   Or  . thiamine  100 mg Intravenous Daily   Continuous Infusions: . sodium chloride 75 mL/hr at 12/25/15  [id number]     LOS: 1 day   Time Spent in minutes   30 minutes  Jshawn Hurta D.O. on 12/25/2015 at 1:35 PM  Between 7am to 7pm - Pager - [phone]  After 7pm go to [email or web address] - password [email or web address]  And look for the night coverage person covering for me after hours  [facility] Office   [phone]

## 2015-12-25 NOTE — Consult Note (Signed)
WOC wound consult note Reason for Consult: Grade 2 skin tear on RLE Wound type:Traumatic Pressure Ulcer POA: No Measurement:4cm x 4cm x 0.2cm sickle-shaped avulsion sustained during fall and for which skin flap was able to be unrolled and replaced over defect at [facility] last week.  Steri strips applied and they remain intact.  20% of dermis remains exposed and is red, moist and with scant serous exudate. Wound bed:As described above Drainage (amount, consistency, odor) As described above Periwound:Intact, ecchymotic.  No edema. Dressing procedure/placement/frequency: Conservative care orders are provided to maintain a moist environment conducive to tissue repair that will not macerate tissue i.e., white petrolatum.  Recommend changes once daily to avoid bacterial growth and to monitor wound healing.  Steri strips will roll and peel off as they dry; family is instructed to allow them to come off naturally and to not forcibly remove. Patient will follow up with PCP after discharge and wound will be monitored by [facility] [place] nursing team will not follow, but will remain available to this patient, the nursing and medical teams.  Please re-consult if needed. Thanks, Maudie Flakes, MSN, RN, [place], Arther Abbott  Pager# [phone]

## 2015-12-25 NOTE — Progress Notes (Signed)
Echocardiogram 2D Echocardiogram has been performed.  Emily Moore 12/25/2015, 11:53 AM

## 2015-12-25 NOTE — Progress Notes (Signed)
Spoke with pt concerning discharge plans and needs.  Pt states that she will need a walker at discharge. A call was placed to pt's son who referred this CM to his wife. Left vm for daughter in law.

## 2015-12-25 NOTE — H&P (Addendum)
History and Physical    Emily Moore [id number] DOB: 01/28/1930 DOA: 12/24/2015  Referring MD/NP/PA: Cleophas Dunker. PCP: Lamar Blinks, MD  Outpatient Specialists: Patient is scheduled to see Dr.Ennever. Oncologist. Patient coming from: Home.  Chief Complaint: Liver mass and abnormal labs and feeling weak.  HPI: Emily Moore is a 82 y.o. female with medical history significant of breast cancer status post mastectomy present on anastrozole, hypertension, abuse who has recently moved to her son's house from [place] was brought to the ER after patient was found to be increasingly weak and sonogram shows liver mass. Patient was found to have elevated LFTs. Sonogram was done. Patient also has been in a week and has had falls and had come to the ER a week ago and at that time CT head did not show anything acute. As per the patient's daughter over the last 3 days patient has been having recurrent episodes of diarrhea. Patient has been on glucose of antibiotics for lower extremity swelling, possible cellulitis and also UTI. Denies any nausea vomiting but did feel abdominal distention for which patient had CT scan of the chest and abdomen which at this time shows metastatic lesions to the liver and bone probably from breast cancer or new ovarian mass concerning for ovarian carcinoma. Patient has been admitted for further management.  ED Course: Patient was found to be orthostatic and was given fluids in the ER. Patient's labs also reveal acute renal failure.  Review of Systems: As per HPI otherwise 10 point review of systems negative.    Past Medical History  Diagnosis Date  . Cancer ([place])   . Hypertension   . Heart murmur   . Arthritis   . Cataract   . COPD (chronic obstructive pulmonary disease) ([place])   . Depression   . Osteoporosis     Past Surgical History  Procedure Laterality Date  . Breast surgery    . Abdominal hysterectomy    . Eye surgery      cataract     reports that she has  been smoking Cigarettes.  She has been smoking about 1.00 pack per day. She does not have any smokeless tobacco history on file. She reports that she drinks alcohol. She reports that she does not use illicit drugs.  No Known Allergies  Family History  Problem Relation Age of Onset  . Heart disease Mother   . Cancer Father     Prior to Admission medications   Medication Sig Start Date End Date Taking? Authorizing Provider  anastrozole (ARIMIDEX) 1 MG tablet Take 1 mg by mouth daily.   Yes Historical Provider, MD  gabapentin (NEURONTIN) 300 MG capsule Take 300 mg by mouth daily.   Yes Historical Provider, MD  quinapril (ACCUPRIL) 5 MG tablet Take 5 mg by mouth daily.    Yes Historical Provider, MD  Sulfamethoxazole-Trimethoprim (BACTRIM DS PO) Take 1 tablet by mouth 2 (two) times daily. ABT Start Date 12/12/15 & End Date 12/26/15   Yes Historical Provider, MD    Physical Exam: Filed Vitals:   12/24/15 2200 12/24/15 2230 12/25/15 0001 12/25/15 0044  BP: 116/58 113/64 113/65 117/59  Pulse: 80 78 82 83  Temp:   98.6 F (37 C) 98.3 F (36.8 C)  TempSrc:   Oral Oral  Resp: 25 22 19 20   SpO2: 92% 92% 93% 93%      Constitutional: NAD, calm, comfortable Filed Vitals:   12/24/15 2200 12/24/15 2230 12/25/15 0001 12/25/15 0044  BP: 116/58 113/64 113/65 117/59  Pulse: 80 78 82 83  Temp:   98.6 F (37 C) 98.3 F (36.8 C)  TempSrc:   Oral Oral  Resp: 25 22 19 20   SpO2: 92% 92% 93% 93%   Eyes: PERRL, lids and conjunctivae normal ENMT: Mucous membranes areDry. Posterior pharynx clear of any exudate or lesions.Normal dentition.  Neck: normal, supple, no masses, no thyromegaly Respiratory: clear to auscultation bilaterally, no wheezing, no crackles. Normal respiratory effort. No accessory muscle use.  Cardiovascular: Regular rate and rhythm, no murmurs / rubs / gallops. No extremity edema. 2+ pedal pulses. No carotid bruits.  Abdomen: no tenderness, no masses palpated. No  hepatosplenomegaly. Bowel sounds positive.  Musculoskeletal: no clubbing / cyanosis. No joint deformity upper and lower extremities. Good ROM, no contractures. Normal muscle tone.  Skin: no rashes, lesions, ulcers. No induration Neurologic: CN 2-12 grossly intact. Sensation intact, DTR normal. Strength 5/5 in all 4.  Psychiatric: Normal judgment and insight. Alert and oriented x 3. Normal mood.    Labs on Admission: I have personally reviewed following labs and imaging studies  CBC:  Recent Labs Lab 12/24/15 1812  WBC 12.8*  NEUTROABS 9.6*  HGB 11.3*  HCT 34.5*  MCV 82.9  PLT [id number]   Basic Metabolic Panel:  Recent Labs Lab 12/24/15 1812  NA 129*  K 4.4  CL 98*  CO2 22  GLUCOSE 119*  BUN 30*  CREATININE 1.29*  CALCIUM 8.9   GFR: Estimated Creatinine Clearance: 21.9 mL/min (by C-G formula based on Cr of 1.29). Liver Function Tests:  Recent Labs Lab 12/24/15 1812  AST 74*  ALT 44  ALKPHOS 240*  BILITOT 0.9  PROT 6.8  ALBUMIN 2.8*    Recent Labs Lab 12/24/15 1812  LIPASE 43    Recent Labs Lab 12/24/15 1812  AMMONIA 35   Coagulation Profile: No results for input(s): INR, PROTIME in the last 168 hours. Cardiac Enzymes: No results for input(s): CKTOTAL, CKMB, CKMBINDEX, TROPONINI in the last 168 hours. BNP (last 3 results) No results for input(s): PROBNP in the last 8760 hours. HbA1C: No results for input(s): HGBA1C in the last 72 hours. CBG: No results for input(s): GLUCAP in the last 168 hours. Lipid Profile: No results for input(s): CHOL, HDL, LDLCALC, TRIG, CHOLHDL, LDLDIRECT in the last 72 hours. Thyroid Function Tests: No results for input(s): TSH, T4TOTAL, FREET4, T3FREE, THYROIDAB in the last 72 hours. Anemia Panel: No results for input(s): VITAMINB12, FOLATE, FERRITIN, TIBC, IRON, RETICCTPCT in the last 72 hours. Urine analysis:    Component Value Date/Time   COLORURINE YELLOW 12/24/2015 2350   APPEARANCEUR CLEAR 12/24/2015 2350    LABSPEC 1.046* 12/24/2015 2350   PHURINE 5.5 12/24/2015 2350   GLUCOSEU NEGATIVE 12/24/2015 2350   HGBUR NEGATIVE 12/24/2015 2350   BILIRUBINUR NEGATIVE 12/24/2015 2350   KETONESUR NEGATIVE 12/24/2015 2350   PROTEINUR NEGATIVE 12/24/2015 2350   NITRITE NEGATIVE 12/24/2015 2350   LEUKOCYTESUR NEGATIVE 12/24/2015 2350   Sepsis Labs: @LABRCNTIP (procalcitonin:4,lacticidven:4) )No results found for this or any previous visit (from the past 240 hour(s)).   Radiological Exams on Admission: Ct Chest W Contrast  12/24/2015  CLINICAL DATA:  Several recent falls. Clinical concern for orthostatics hypotension. Recently diagnosed liver metastases or multifocal hepatocellular carcinoma. History of alcohol abuse and left breast cancer. EXAM: CT CHEST, ABDOMEN, AND PELVIS WITH CONTRAST TECHNIQUE: Multidetector CT imaging of the chest, abdomen and pelvis was performed following the standard protocol during bolus administration of intravenous contrast. CONTRAST:  10mL ISOVUE-300 IOPAMIDOL (ISOVUE-300) INJECTION 61% COMPARISON:  Right upper quadrant abdomen ultrasound obtained earlier today. FINDINGS: CT CHEST FINDINGS Mediastinum/Lymph Nodes: Dense atheromatous coronary artery calcifications. No enlarged lymph nodes. No intravascular thrombi visualized. Lungs/Pleura: Small right pleural effusion. 1.1 cm irregular nodule with adjacent pleural tethering in the anterior right lower lobe on image number 43 of series 10. 1.1 cm mildly irregular nodule in the left lower lobe on image number 57 of series 10. Mild right lower lobe atelectasis. The lungs are diffusely hyperexpanded with right lower lobe bullous changes and diffuse peribronchial thickening. Musculoskeletal: Sclerotic lesion in the superior aspect of the T4 vertebral body. Sclerotic lesion in the posterior aspect of the T9 vertebral body. Sclerotic lesions in the T12 vertebral body. Approximately 20% T12 superior endplate compression deformity with sclerosis. No  acute fracture lines. No bony retropulsion. Postmastectomy changes on the left. CT ABDOMEN PELVIS FINDINGS Hepatobiliary: Multiple poorly defined liver masses. This is more pronounced and slightly more confluent in the superior aspect of the liver on the right. Lobulated liver contours. The liver is also enlarged. Mildly prominent lateral segment left lobe of the liver and caudate lobe. Gallbladder Phrygian cap. Pancreas: No mass, inflammatory changes, or other significant abnormality. Spleen: Within normal limits in size and appearance. Adrenals/Urinary Tract: The right kidney is inferiorly displaced by the enlarged liver. Otherwise, normal appearing kidneys, ureters and urinary bladder. No urinary tract calculi or hydronephrosis. Stomach/Bowel: Small sliding hiatal hernia. Multiple diverticula throughout the colon. No small bowel abnormalities. Normal appearing appendix. Vascular/Lymphatic: Dense atheromatous arterial calcifications. No enlarged lymph nodes. Reproductive: Mildly enlarged left ovary. Large multi-septated cystic and solid right ovarian mass. On image number 90 of series 8, this measures 16.2 x 10.0 cm in maximum dimensions. On sagittal image number 76, this measures 12.3 cm in length. Surgically absent uterus. Other: No free peritoneal fluid. Musculoskeletal: Rounded sclerotic focus in the superior, posterior aspect of the L1 vertebral body. Disc space narrowing, spur formation and discogenic sclerosis at multiple levels. IMPRESSION: 1. 16.2 x 12.3 x 10.0 cm cystic and solid right ovarian mass, highly suspicious for a primary ovarian carcinoma. An ovarian metastasis is less likely. 2. Hepatomegaly with multiple masses throughout the liver. These most likely represent metastases from metastatic breast cancer or metastatic ovarian carcinoma. However, multifocal hepatocellular carcinoma remains a possibility given additional changes suggesting the possibility of cirrhosis of the liver with a history of  alcohol abuse. 3. Multiple sclerotic bone metastases, compatible with metastatic breast cancer. 4. Extensive colonic diverticulosis. Electronically Signed   By: Claudie Revering M.D.   On: 12/24/2015 20:44   Ct Abdomen Pelvis W Contrast  12/24/2015  CLINICAL DATA:  Several recent falls. Clinical concern for orthostatics hypotension. Recently diagnosed liver metastases or multifocal hepatocellular carcinoma. History of alcohol abuse and left breast cancer. EXAM: CT CHEST, ABDOMEN, AND PELVIS WITH CONTRAST TECHNIQUE: Multidetector CT imaging of the chest, abdomen and pelvis was performed following the standard protocol during bolus administration of intravenous contrast. CONTRAST:  65mL ISOVUE-300 IOPAMIDOL (ISOVUE-300) INJECTION 61% COMPARISON:  Right upper quadrant abdomen ultrasound obtained earlier today. FINDINGS: CT CHEST FINDINGS Mediastinum/Lymph Nodes: Dense atheromatous coronary artery calcifications. No enlarged lymph nodes. No intravascular thrombi visualized. Lungs/Pleura: Small right pleural effusion. 1.1 cm irregular nodule with adjacent pleural tethering in the anterior right lower lobe on image number 43 of series 10. 1.1 cm mildly irregular nodule in the left lower lobe on image number 57 of series 10. Mild right lower lobe atelectasis. The lungs are diffusely hyperexpanded with right lower lobe bullous changes and  diffuse peribronchial thickening. Musculoskeletal: Sclerotic lesion in the superior aspect of the T4 vertebral body. Sclerotic lesion in the posterior aspect of the T9 vertebral body. Sclerotic lesions in the T12 vertebral body. Approximately 20% T12 superior endplate compression deformity with sclerosis. No acute fracture lines. No bony retropulsion. Postmastectomy changes on the left. CT ABDOMEN PELVIS FINDINGS Hepatobiliary: Multiple poorly defined liver masses. This is more pronounced and slightly more confluent in the superior aspect of the liver on the right. Lobulated liver contours.  The liver is also enlarged. Mildly prominent lateral segment left lobe of the liver and caudate lobe. Gallbladder Phrygian cap. Pancreas: No mass, inflammatory changes, or other significant abnormality. Spleen: Within normal limits in size and appearance. Adrenals/Urinary Tract: The right kidney is inferiorly displaced by the enlarged liver. Otherwise, normal appearing kidneys, ureters and urinary bladder. No urinary tract calculi or hydronephrosis. Stomach/Bowel: Small sliding hiatal hernia. Multiple diverticula throughout the colon. No small bowel abnormalities. Normal appearing appendix. Vascular/Lymphatic: Dense atheromatous arterial calcifications. No enlarged lymph nodes. Reproductive: Mildly enlarged left ovary. Large multi-septated cystic and solid right ovarian mass. On image number 90 of series 8, this measures 16.2 x 10.0 cm in maximum dimensions. On sagittal image number 76, this measures 12.3 cm in length. Surgically absent uterus. Other: No free peritoneal fluid. Musculoskeletal: Rounded sclerotic focus in the superior, posterior aspect of the L1 vertebral body. Disc space narrowing, spur formation and discogenic sclerosis at multiple levels. IMPRESSION: 1. 16.2 x 12.3 x 10.0 cm cystic and solid right ovarian mass, highly suspicious for a primary ovarian carcinoma. An ovarian metastasis is less likely. 2. Hepatomegaly with multiple masses throughout the liver. These most likely represent metastases from metastatic breast cancer or metastatic ovarian carcinoma. However, multifocal hepatocellular carcinoma remains a possibility given additional changes suggesting the possibility of cirrhosis of the liver with a history of alcohol abuse. 3. Multiple sclerotic bone metastases, compatible with metastatic breast cancer. 4. Extensive colonic diverticulosis. Electronically Signed   By: Claudie Revering M.D.   On: 12/24/2015 20:44   US Abdomen Limited Ruq  12/24/2015  CLINICAL DATA:  Elevated liver function  tests. Alcohol abuse. Hepatomegaly. Personal history of left breast carcinoma. EXAM: US ABDOMEN LIMITED - RIGHT UPPER QUADRANT COMPARISON:  None. FINDINGS: Gallbladder: No gallstones or wall thickening visualized. No sonographic Murphy sign noted by sonographer. Common bile duct: Diameter: 3 mm, within normal limits. Liver: Liver appears enlarged. There are numerous masses seen throughout both the right and left hepatic lobes which are mildly hyperechoic in echotexture. These masses measure up to 4 cm in size. Differential diagnosis includes diffuse hepatic metastases and multifocal hepatocellular carcinoma in setting of cirrhosis. Minimal perihepatic ascites noted. IMPRESSION: Hepatomegaly with multiple diffuse hepatic masses measuring up to 4 cm and mild perihepatic ascites. Differential diagnosis includes hepatic metastases and multifocal hepatocellular carcinoma in setting of cirrhosis. Abdomen MRI without and with contrast recommended for further evaluation. No evidence of gallstones or biliary dilatation. These results will be called to the ordering clinician or representative by the Radiologist Assistant, and communication documented in the PACS or zVision Dashboard. Electronically Signed   By: Earle Gell M.D.   On: 12/24/2015 08:53    EKG: Independently reviewed. Normal sinus rhythm.  Assessment/Plan Principal Problem:   Orthostatic hypotension Active Problems:   Ovarian mass   Liver metastases ([place])   Essential hypertension    #1. Orthostatic hypotension/weakness - patient is found to be orthostatic in the ER. At this time we will gently hydrate. Patient probably  is dehydrated from recent diarrhea and poor oral intake. Recheck orthostatics in a.m. #2. Diarrhea - since patient was on antibiotics recently we will check stool for C. Difficile. #3. Metastatic lesions to the liver and bone concerning for breast cancer or ovarian cancer - patient is already scheduled to follow-up with Dr. Burney Gauze letter this month. Please consult Dr. Cherlynn Polo in a.m. for further recommendations. #4. Acute renal failure probably from dehydration from diarrhea and poor oral intake - gently hydrate and recheck metabolic panel. Will hold ACE inhibitor for now. #5. Hypertension - I'm holding off his inhibitors due to orthostatic changes and renal failure. When necessary IV hydralazine for now. #6. Alcohol abuse - as per patient's daughter patient has not had any alcohol last 2 weeks. Patient is on CIWA protocol. #7. Anemia - follow CBC.  Since patient also was complaining of exertional shortness of breath we will check VQ scan and 2-D echo.   DVT prophylaxis: Lovenox. Code Status: DO NOT RESUSCITATE.  Family Communication: Patient's son and daughter-in-law.  Disposition Plan: Home.  Consults called: None.  Admission status: Observation. Likely stay 23 hours.    Rise Patience MD [facility] Pager [phone].  If 7PM-7AM, please contact night-coverage [email or web address] Password [email or web address]  12/25/2015, 1:00 AM

## 2015-12-25 NOTE — Progress Notes (Signed)
Spoke with pt, daughter in law and son at bedside concerning [facility] and DME. [facility] was selected, referral was given to in house rep.

## 2015-12-26 DIAGNOSIS — R634 Abnormal weight loss: Secondary | ICD-10-CM

## 2015-12-26 DIAGNOSIS — R160 Hepatomegaly, not elsewhere classified: Principal | ICD-10-CM

## 2015-12-26 DIAGNOSIS — R918 Other nonspecific abnormal finding of lung field: Secondary | ICD-10-CM

## 2015-12-26 DIAGNOSIS — I10 Essential (primary) hypertension: Secondary | ICD-10-CM

## 2015-12-26 DIAGNOSIS — N839 Noninflammatory disorder of ovary, fallopian tube and broad ligament, unspecified: Secondary | ICD-10-CM | POA: Diagnosis not present

## 2015-12-26 DIAGNOSIS — Z853 Personal history of malignant neoplasm of breast: Secondary | ICD-10-CM

## 2015-12-26 DIAGNOSIS — I951 Orthostatic hypotension: Secondary | ICD-10-CM | POA: Diagnosis not present

## 2015-12-26 DIAGNOSIS — C801 Malignant (primary) neoplasm, unspecified: Secondary | ICD-10-CM

## 2015-12-26 DIAGNOSIS — R970 Elevated carcinoembryonic antigen [CEA]: Secondary | ICD-10-CM

## 2015-12-26 DIAGNOSIS — R770 Abnormality of albumin: Secondary | ICD-10-CM

## 2015-12-26 DIAGNOSIS — R531 Weakness: Secondary | ICD-10-CM

## 2015-12-26 DIAGNOSIS — C787 Secondary malignant neoplasm of liver and intrahepatic bile duct: Secondary | ICD-10-CM | POA: Diagnosis not present

## 2015-12-26 LAB — CEA: CEA: 641.9 ng/mL — ABNORMAL HIGH (ref 0.0–4.7)

## 2015-12-26 LAB — PREALBUMIN: PREALBUMIN: 2.8 mg/dL — AB (ref 18–38)

## 2015-12-26 LAB — AFP TUMOR MARKER: AFP-Tumor Marker: 5.2 ng/mL (ref 0.0–8.3)

## 2015-12-26 LAB — CANCER ANTIGEN 19-9: CA 19-9: 88 U/mL — ABNORMAL HIGH (ref 0–35)

## 2015-12-26 MED ORDER — MORPHINE SULFATE (PF) 2 MG/ML IV SOLN
2.0000 mg | INTRAVENOUS | Status: DC | PRN
  Administered 2015-12-26 – 2015-12-28 (×5): 2 mg via INTRAVENOUS
  Filled 2015-12-26 (×5): qty 1

## 2015-12-26 NOTE — Progress Notes (Signed)
Pt's daughter in law [name]) text this CM at 7:50 PM with concern of pt's discharge to home.  [name] states that the pt almost fell with her helping pt to the bathroom. A call to [name] [phone]) this morning was made to discuss other options. CSW informed that SNF was one of the options.

## 2015-12-26 NOTE — Consult Note (Signed)
Referral MD  Reason for Referral: Hepatic masses. History of stage II carcinoma of the left breast   Chief Complaint  Patient presents with  . Abnormal Lab  : I am very weak and tired.  HPI: Ms. Misher is a very charming 82 year old white female. She is originally from [place] area and she apparently had breast cancer of the left breast. This was a year or so ago. It sounds like she was put on Arimidex.  She now presents with multiple hepatic masses. She has lost weight. She is tired. She was admitted because of weakness. She had a CT scan of the body done. This showed pulmonary nodules. She had multiple poorly defined hepatic masses. She had a very large right ovarian mass. This measures 16 x 10 x 12.3 cm.  She probably had a partial hysterectomy according to her daughter. It was felt that this probably was a primary ovarian malignancy.  When she was admitted, her albumin was 2.5. Her liver tests showed an alkaline phosphatase of 213. Her white cell count 11.2. Her hemoglobin was 10.9.  Her CEA is 642. Her CA-19-9-9 is 88.  She's not eating much. She has abdominal pain. Again she's lost weight.  She does have a past history of tobacco use. Other she still does smoke a little bit.  Overall, her performance status is ECOG 3.    Past Medical History  Diagnosis Date  . Cancer ([place])   . Hypertension   . Heart murmur   . Arthritis   . Cataract   . COPD (chronic obstructive pulmonary disease) ([place])   . Depression   . Osteoporosis   :  Past Surgical History  Procedure Laterality Date  . Breast surgery    . Abdominal hysterectomy    . Eye surgery      cataract  :   Current facility-administered medications:  .  acetaminophen (TYLENOL) tablet 650 mg, 650 mg, Oral, Q6H PRN **OR** acetaminophen (TYLENOL) suppository 650 mg, 650 mg, Rectal, Q6H PRN, Rise Patience, MD .  anastrozole (ARIMIDEX) tablet 1 mg, 1 mg, Oral, Daily, Rise Patience, MD, 1 mg at 12/25/15  [phone] .  enoxaparin (LOVENOX) injection 30 mg, 30 mg, Subcutaneous, QHS, Rise Patience, MD, 30 mg at 12/25/15 2100 .  feeding supplement (ENSURE ENLIVE) (ENSURE ENLIVE) liquid 237 mL, 237 mL, Oral, Q24H, Maryann Mikhail, DO, 237 mL at 12/25/15 1400 .  folic acid (FOLVITE) tablet 1 mg, 1 mg, Oral, Daily, Rise Patience, MD, 1 mg at 12/25/15 [id number] .  gabapentin (NEURONTIN) capsule 300 mg, 300 mg, Oral, Daily, Rise Patience, MD, 300 mg at 12/25/15 [id number] .  hydrALAZINE (APRESOLINE) injection 10 mg, 10 mg, Intravenous, Q4H PRN, Rise Patience, MD .  LORazepam (ATIVAN) tablet 1 mg, 1 mg, Oral, Q6H PRN **OR** LORazepam (ATIVAN) injection 1 mg, 1 mg, Intravenous, Q6H PRN, Rise Patience, MD .  multivitamin with minerals tablet 1 tablet, 1 tablet, Oral, Daily, Rise Patience, MD, 1 tablet at 12/25/15 [phone] .  ondansetron (ZOFRAN) tablet 4 mg, 4 mg, Oral, Q6H PRN **OR** ondansetron (ZOFRAN) injection 4 mg, 4 mg, Intravenous, Q6H PRN, Rise Patience, MD .  thiamine (VITAMIN B-1) tablet 100 mg, 100 mg, Oral, Daily, 100 mg at 12/25/15 0937 **OR** thiamine (B-1) injection 100 mg, 100 mg, Intravenous, Daily, Rise Patience, MD:  . anastrozole  1 mg Oral Daily  . enoxaparin (LOVENOX) injection  30 mg Subcutaneous QHS  . feeding supplement (ENSURE ENLIVE)  237 mL Oral Q24H  . folic acid  1 mg Oral Daily  . gabapentin  300 mg Oral Daily  . multivitamin with minerals  1 tablet Oral Daily  . thiamine  100 mg Oral Daily   Or  . thiamine  100 mg Intravenous Daily  :  No Known Allergies:  Family History  Problem Relation Age of Onset  . Heart disease Mother   . Cancer Father   :  Social History   Social History  . Marital Status: Widowed    Spouse Name: N/A  . Number of Children: N/A  . Years of Education: N/A   Occupational History  . Not on file.   Social History Main Topics  . Smoking status: Current Every Day Smoker -- 1.00 packs/day    Types: Cigarettes   . Smokeless tobacco: Not on file  . Alcohol Use: Yes     Comment: Vodka.  . Drug Use: No  . Sexual Activity: Not on file   Other Topics Concern  . Not on file   Social History Narrative  :  Pertinent items are noted in HPI.  Exam: Patient Vitals for the past 24 hrs:  BP Temp Temp src Pulse Resp SpO2 Weight  12/26/15 0607 125/69 mmHg 97.8 F (36.6 C) Oral 77 20 92 % 101 lb (45.813 kg)  12/26/15 0019 123/76 mmHg 99 F (37.2 C) Oral 92 20 92 % -  12/25/15 1450 (!) 122/59 mmHg 98.6 F (37 C) Oral 80 20 94 % -   Somewhat thin, almost cachectic white female. She is quite elderly. Head and neck exam shows temporal muscle wasting. There is no oral lesions. She has no adenopathy in the neck. Lungs are clear bilaterally. Cardiac exam sightly tachycardic but regular. She has a 1/6 systolic ejection murmur. Breast exam shows right breast with no masses, edema or erythema. Left chest wall shows a mastectomy. This is healed. There is no bilateral axillary adenopathy. Abdomen is soft. She has some tenderness in the right upper quadrant. There may be some slight hepatomegaly. Her liver edge is palpable at the right costal margin. There is no splenomegaly. There is no obvious fluid wave. Extremities shows most likely an upper lower extremities. She has osteoarthritic changes in her joints. Skin exam shows skin that is slightly dry.    Recent Labs  12/24/15 1812 12/25/15 0502  WBC 12.8* 11.2*  HGB 11.3* 10.9*  HCT 34.5* 32.2*  PLT 325 347    Recent Labs  12/24/15 1812 12/25/15 0502  NA 129*  --   K 4.4  --   CL 98*  --   CO2 22  --   GLUCOSE 119*  --   BUN 30*  --   CREATININE 1.29* 0.81  CALCIUM 8.9  --     Blood smear review:  None  Pathology: None     Assessment and Plan:  Ms. Gridley is a 82 year old white female. She had breast cancer. So ago. She had a mastectomy.  She now has metastatic disease of unclear etiology. I would be surprised that this was rest cancer. We  will have to see what her CA 27.29 is.  The CEA is incredibly elevated. As such, one with think that this is an added no carcinoma. She has a large mass in the pelvis. This appears to be a ovarian primary. We will see what her CA 125 is.  I think the real issue is that her performance status is quite poor. I  just don't see that she is a candidate for any kind of systemic therapy. As such, I might sure we really have to put her through biopsies.  Her daughter really wants comfort for her. I totally agree with this. I think that getting hospice involved would definitely be a "plus".  We will have to see what her labs show. I am sending off a prealbumin on her. I think this will be helpful.  Ultimately, I think that we are in a situation that palliative care will be the primary mode of intervention.  Once we have some more studies back, then we can talk to the patient and her family.  Pain control certainly will be helpful. Given her age and her marginal status, we had to be cautious with how aggressive we can be with pain medication.  We will follow her along. We will pray hard for her. She does have a strong faith.  Pete E.  Phillipians 1:9

## 2015-12-26 NOTE — Progress Notes (Signed)
PROGRESS NOTE    Emily Moore  [id number] DOB: [birth year] DOA: 12/24/2015 PCP: Lamar Blinks, MD  Outpatient Specialists:   Brief Narrative:  On 12/25/2015 by Dr. Gean Birchwood Emily Moore is a 82 y.o. female with medical history significant of breast cancer status post mastectomy present on anastrozole, hypertension, abuse who has recently moved to her son's house from [place] was brought to the ER after patient was found to be increasingly weak and sonogram shows liver mass. Patient was found to have elevated LFTs. Sonogram was done. Patient also has been in a week and has had falls and had come to the ER a week ago and at that time CT head did not show anything acute. As per the patient's daughter over the last 3 days patient has been having recurrent episodes of diarrhea. Patient has been on glucose of antibiotics for lower extremity swelling, possible cellulitis and also UTI. Denies any nausea vomiting but did feel abdominal distention for which patient had CT scan of the chest and abdomen which at this time shows metastatic lesions to the liver and bone probably from breast cancer or new ovarian mass concerning for ovarian carcinoma. Patient has been admitted for further management.  Assessment & Plan   Orthostatic hypotension -Patient found to be hypotensive in the emergency department -Resolved, Continue IV hydration -Likely secondary to dehydration from diarrhea and poor oral intake  Dyspnea -Secondary to unknown cause, seems to have Resolved -Echocardiogram: EF [id number], grade 1 diastolic dysfunction CT chest with contrast: Small right crural effusion, mild right lower lobe atelectasis, lungs are diffusely hyperexpanded. 1.1 cm irregular nodule with adjacent pleural tethering in the right lower lobe. -will continue to monitor  Diarrhea -Patient has not had any further loose bowel movements. She actually had a formed stool during hospitalization  -Cannot some sample for C.  Difficile. -If diarrhea occurs again, will obtain sample  Metastatic lesions to liver and bone concerning for breast cancer or ovarian cancer -Oncology consulted and appreciated -CA 19-9: 88, CEA 642 -Oncology does not believe patient would be a candidate for systemic therapy.  -Spoke with patient and daughter at bedside, would like more comfort care approach -will consult palliative care  Acute renal injury -Likely secondary to dehydration from diarrhea as well as poor oral intake -ACE inhibitor held -Creatinine was 1.29 upon admission, currently 0.81 -Continue to monitor BMP  Alcohol abuse -Currently not having withdrawal symptoms or DTs -Continue CIWA protocol  Normocytic Anemia -Appears to be stable, continue to monitor CBC  Essential hypertension -Medications held, continue hydralazine as needed  Generalized weakness -likely secondary to the above -PT recommended home health -Daughter is worried that patient may need more assistance and care than she can provide -CM/SW consulted  DVT Prophylaxis  Lovenox  Code Status: DNR  Family Communication: Family at bedside  Disposition Plan: Admitted. Will consult palliative care  Consultants Oncology Palliative care  Procedures  Echocardiogram  Antibiotics   Anti-infectives    None      Subjective:   Emily Moore seen and examined today. Patient feels better today.  Denies abdominal pain at this time.  States she was able to rest last night.   Denies any chest pain or shortness of breath, nausea or vomiting.   Objective:   Filed Vitals:   12/25/15 0554 12/25/15 1450 12/26/15 0019 12/26/15 0607  BP: 129/63 122/59 123/76 125/69  Pulse: 83 80 92 77  Temp: 98.4 F (36.9 C) 98.6 F (37 C) 99 F (37.2 C) 97.8 F (  36.6 C)  TempSrc: Oral Oral Oral Oral  Resp: 20 20 20 20   Height:      Weight:    45.813 kg (101 lb)  SpO2: 92% 94% 92% 92%    Intake/Output Summary (Last 24 hours) at 12/26/15 1035 Last data  filed at 12/25/15 2359  Gross per 24 hour  Intake 1513.75 ml  Output      0 ml  Net 1513.75 ml   Filed Weights   12/25/15 0044 12/26/15 0607  Weight: 43.092 kg (95 lb) 45.813 kg (101 lb)    Exam  General: Well developed, well nourished, no distress  HEENT: NCAT,  mucous membranes moist  Cardiovascular: S1 S2 auscultated, 2/6SEM, RRR  Respiratory: Clear to auscultation bilaterally   Abdomen: Soft, nontender, nondistended, + bowel sounds  Extremities: warm dry without cyanosis clubbing or edema. RLE wrapped.  Neuro: AAOx3, nonfocal  Psych: Normal affect and demeanor, pleasant   Data Reviewed: I have personally reviewed following labs and imaging studies  CBC:  Recent Labs Lab 12/24/15 1812 12/25/15 0502  WBC 12.8* 11.2*  NEUTROABS 9.6*  --   HGB 11.3* 10.9*  HCT 34.5* 32.2*  MCV 82.9 82.4  PLT 325 [id number]   Basic Metabolic Panel:  Recent Labs Lab 12/24/15 1812 12/25/15 0502  NA 129*  --   K 4.4  --   CL 98*  --   CO2 22  --   GLUCOSE 119*  --   BUN 30*  --   CREATININE 1.29* 0.81  CALCIUM 8.9  --    GFR: Estimated Creatinine Clearance: 37.4 mL/min (by C-G formula based on Cr of 0.81). Liver Function Tests:  Recent Labs Lab 12/24/15 1812 12/25/15 0502  AST 74* 59*  ALT 44 35  ALKPHOS 240* 213*  BILITOT 0.9 0.9  PROT 6.8 6.0*  ALBUMIN 2.8* 2.5*    Recent Labs Lab 12/24/15 1812  LIPASE 43    Recent Labs Lab 12/24/15 1812  AMMONIA 35   Coagulation Profile: No results for input(s): INR, PROTIME in the last 168 hours. Cardiac Enzymes: No results for input(s): CKTOTAL, CKMB, CKMBINDEX, TROPONINI in the last 168 hours. BNP (last 3 results) No results for input(s): PROBNP in the last 8760 hours. HbA1C: No results for input(s): HGBA1C in the last 72 hours. CBG: No results for input(s): GLUCAP in the last 168 hours. Lipid Profile: No results for input(s): CHOL, HDL, LDLCALC, TRIG, CHOLHDL, LDLDIRECT in the last 72 hours. Thyroid  Function Tests: No results for input(s): TSH, T4TOTAL, FREET4, T3FREE, THYROIDAB in the last 72 hours. Anemia Panel: No results for input(s): VITAMINB12, FOLATE, FERRITIN, TIBC, IRON, RETICCTPCT in the last 72 hours. Urine analysis:    Component Value Date/Time   COLORURINE YELLOW 12/24/2015 2350   APPEARANCEUR CLEAR 12/24/2015 2350   LABSPEC 1.046* 12/24/2015 2350   PHURINE 5.5 12/24/2015 2350   GLUCOSEU NEGATIVE 12/24/2015 2350   HGBUR NEGATIVE 12/24/2015 2350   BILIRUBINUR NEGATIVE 12/24/2015 2350   KETONESUR NEGATIVE 12/24/2015 2350   PROTEINUR NEGATIVE 12/24/2015 2350   NITRITE NEGATIVE 12/24/2015 2350   LEUKOCYTESUR NEGATIVE 12/24/2015 2350   Sepsis Labs: @LABRCNTIP (procalcitonin:4,lacticidven:4)  )No results found for this or any previous visit (from the past 240 hour(s)).    Radiology Studies: Ct Chest W Contrast  12/24/2015  CLINICAL DATA:  Several recent falls. Clinical concern for orthostatics hypotension. Recently diagnosed liver metastases or multifocal hepatocellular carcinoma. History of alcohol abuse and left breast cancer. EXAM: CT CHEST, ABDOMEN, AND PELVIS WITH CONTRAST TECHNIQUE: Multidetector  CT imaging of the chest, abdomen and pelvis was performed following the standard protocol during bolus administration of intravenous contrast. CONTRAST:  72mL ISOVUE-300 IOPAMIDOL (ISOVUE-300) INJECTION 61% COMPARISON:  Right upper quadrant abdomen ultrasound obtained earlier today. FINDINGS: CT CHEST FINDINGS Mediastinum/Lymph Nodes: Dense atheromatous coronary artery calcifications. No enlarged lymph nodes. No intravascular thrombi visualized. Lungs/Pleura: Small right pleural effusion. 1.1 cm irregular nodule with adjacent pleural tethering in the anterior right lower lobe on image number 43 of series 10. 1.1 cm mildly irregular nodule in the left lower lobe on image number 57 of series 10. Mild right lower lobe atelectasis. The lungs are diffusely hyperexpanded with right lower  lobe bullous changes and diffuse peribronchial thickening. Musculoskeletal: Sclerotic lesion in the superior aspect of the T4 vertebral body. Sclerotic lesion in the posterior aspect of the T9 vertebral body. Sclerotic lesions in the T12 vertebral body. Approximately 20% T12 superior endplate compression deformity with sclerosis. No acute fracture lines. No bony retropulsion. Postmastectomy changes on the left. CT ABDOMEN PELVIS FINDINGS Hepatobiliary: Multiple poorly defined liver masses. This is more pronounced and slightly more confluent in the superior aspect of the liver on the right. Lobulated liver contours. The liver is also enlarged. Mildly prominent lateral segment left lobe of the liver and caudate lobe. Gallbladder Phrygian cap. Pancreas: No mass, inflammatory changes, or other significant abnormality. Spleen: Within normal limits in size and appearance. Adrenals/Urinary Tract: The right kidney is inferiorly displaced by the enlarged liver. Otherwise, normal appearing kidneys, ureters and urinary bladder. No urinary tract calculi or hydronephrosis. Stomach/Bowel: Small sliding hiatal hernia. Multiple diverticula throughout the colon. No small bowel abnormalities. Normal appearing appendix. Vascular/Lymphatic: Dense atheromatous arterial calcifications. No enlarged lymph nodes. Reproductive: Mildly enlarged left ovary. Large multi-septated cystic and solid right ovarian mass. On image number 90 of series 8, this measures 16.2 x 10.0 cm in maximum dimensions. On sagittal image number 76, this measures 12.3 cm in length. Surgically absent uterus. Other: No free peritoneal fluid. Musculoskeletal: Rounded sclerotic focus in the superior, posterior aspect of the L1 vertebral body. Disc space narrowing, spur formation and discogenic sclerosis at multiple levels. IMPRESSION: 1. 16.2 x 12.3 x 10.0 cm cystic and solid right ovarian mass, highly suspicious for a primary ovarian carcinoma. An ovarian metastasis is  less likely. 2. Hepatomegaly with multiple masses throughout the liver. These most likely represent metastases from metastatic breast cancer or metastatic ovarian carcinoma. However, multifocal hepatocellular carcinoma remains a possibility given additional changes suggesting the possibility of cirrhosis of the liver with a history of alcohol abuse. 3. Multiple sclerotic bone metastases, compatible with metastatic breast cancer. 4. Extensive colonic diverticulosis. Electronically Signed   By: Claudie Revering M.D.   On: 12/24/2015 20:44   Ct Abdomen Pelvis W Contrast  12/24/2015  CLINICAL DATA:  Several recent falls. Clinical concern for orthostatics hypotension. Recently diagnosed liver metastases or multifocal hepatocellular carcinoma. History of alcohol abuse and left breast cancer. EXAM: CT CHEST, ABDOMEN, AND PELVIS WITH CONTRAST TECHNIQUE: Multidetector CT imaging of the chest, abdomen and pelvis was performed following the standard protocol during bolus administration of intravenous contrast. CONTRAST:  41mL ISOVUE-300 IOPAMIDOL (ISOVUE-300) INJECTION 61% COMPARISON:  Right upper quadrant abdomen ultrasound obtained earlier today. FINDINGS: CT CHEST FINDINGS Mediastinum/Lymph Nodes: Dense atheromatous coronary artery calcifications. No enlarged lymph nodes. No intravascular thrombi visualized. Lungs/Pleura: Small right pleural effusion. 1.1 cm irregular nodule with adjacent pleural tethering in the anterior right lower lobe on image number 43 of series 10. 1.1 cm mildly irregular  nodule in the left lower lobe on image number 57 of series 10. Mild right lower lobe atelectasis. The lungs are diffusely hyperexpanded with right lower lobe bullous changes and diffuse peribronchial thickening. Musculoskeletal: Sclerotic lesion in the superior aspect of the T4 vertebral body. Sclerotic lesion in the posterior aspect of the T9 vertebral body. Sclerotic lesions in the T12 vertebral body. Approximately 20% T12 superior  endplate compression deformity with sclerosis. No acute fracture lines. No bony retropulsion. Postmastectomy changes on the left. CT ABDOMEN PELVIS FINDINGS Hepatobiliary: Multiple poorly defined liver masses. This is more pronounced and slightly more confluent in the superior aspect of the liver on the right. Lobulated liver contours. The liver is also enlarged. Mildly prominent lateral segment left lobe of the liver and caudate lobe. Gallbladder Phrygian cap. Pancreas: No mass, inflammatory changes, or other significant abnormality. Spleen: Within normal limits in size and appearance. Adrenals/Urinary Tract: The right kidney is inferiorly displaced by the enlarged liver. Otherwise, normal appearing kidneys, ureters and urinary bladder. No urinary tract calculi or hydronephrosis. Stomach/Bowel: Small sliding hiatal hernia. Multiple diverticula throughout the colon. No small bowel abnormalities. Normal appearing appendix. Vascular/Lymphatic: Dense atheromatous arterial calcifications. No enlarged lymph nodes. Reproductive: Mildly enlarged left ovary. Large multi-septated cystic and solid right ovarian mass. On image number 90 of series 8, this measures 16.2 x 10.0 cm in maximum dimensions. On sagittal image number 76, this measures 12.3 cm in length. Surgically absent uterus. Other: No free peritoneal fluid. Musculoskeletal: Rounded sclerotic focus in the superior, posterior aspect of the L1 vertebral body. Disc space narrowing, spur formation and discogenic sclerosis at multiple levels. IMPRESSION: 1. 16.2 x 12.3 x 10.0 cm cystic and solid right ovarian mass, highly suspicious for a primary ovarian carcinoma. An ovarian metastasis is less likely. 2. Hepatomegaly with multiple masses throughout the liver. These most likely represent metastases from metastatic breast cancer or metastatic ovarian carcinoma. However, multifocal hepatocellular carcinoma remains a possibility given additional changes suggesting the  possibility of cirrhosis of the liver with a history of alcohol abuse. 3. Multiple sclerotic bone metastases, compatible with metastatic breast cancer. 4. Extensive colonic diverticulosis. Electronically Signed   By: Claudie Revering M.D.   On: 12/24/2015 20:44     Scheduled Meds: . anastrozole  1 mg Oral Daily  . enoxaparin (LOVENOX) injection  30 mg Subcutaneous QHS  . feeding supplement (ENSURE ENLIVE)  237 mL Oral Q24H  . folic acid  1 mg Oral Daily  . gabapentin  300 mg Oral Daily  . multivitamin with minerals  1 tablet Oral Daily  . thiamine  100 mg Oral Daily   Or  . thiamine  100 mg Intravenous Daily   Continuous Infusions:     LOS: 2 days   Time Spent in minutes   30 minutes  Emily Moore D.O. on 12/26/2015 at 10:35 AM  Between 7am to 7pm - Pager - [phone]  After 7pm go to [email or web address] - password [email or web address]  And look for the night coverage person covering for me after hours  [facility] Office  [phone]

## 2015-12-26 NOTE — Progress Notes (Signed)
[facility]   [facility] is providing the following services: RW  If patient discharges after hours, please call [phone].   Linward Headland 12/26/2015, 9:52 AM

## 2015-12-26 NOTE — Progress Notes (Signed)
Physical Therapy Treatment Patient Details Name: Emily Moore MRN: [id number] DOB: 10-17-1929 Today's Date: Dec 31, 2015    History of Present Illness 82 yo female admitted with orthostatic hypotension, CT+ mets to liver, bone. Hx of breast cancer, osteoporosis, HTN, COPD, arthritis, depression.    PT Comments    Pt reports feeling better today and able to tolerate increase in distance around unit.    Follow Up Recommendations  Home health PT;Supervision/Assistance - 24 hour     Equipment Recommendations  Rolling walker with 5" wheels (youth)    Recommendations for Other Services       Precautions / Restrictions Precautions Precautions: Fall    Mobility  Bed Mobility Overal bed mobility: Needs Assistance Bed Mobility: Supine to Sit;Sit to Supine     Supine to sit: Supervision Sit to supine: Supervision      Transfers Overall transfer level: Needs assistance Equipment used: Rolling walker (2 wheeled) Transfers: Sit to/from Stand Sit to Stand: Min guard         General transfer comment: verbal cues for safe technique  Ambulation/Gait Ambulation/Gait assistance: Min guard Ambulation Distance (Feet): 160 Feet Assistive device: Rolling walker (2 wheeled) Gait Pattern/deviations: Step-through pattern;Decreased stride length     General Gait Details: verbal cues for safe use of RW   Stairs            Wheelchair Mobility    Modified Rankin (Stroke Patients Only)       Balance                                    Cognition Arousal/Alertness: Awake/alert Behavior During Therapy: WFL for tasks assessed/performed Overall Cognitive Status: Within Functional Limits for tasks assessed                      Exercises      General Comments        Pertinent Vitals/Pain Pain Assessment: Faces Faces Pain Scale: Hurts even more Pain Location: under left breast Pain Descriptors / Indicators: Sore Pain Intervention(s): Limited  activity within patient's tolerance;Monitored during session;Repositioned    Home Living                      Prior Function            PT Goals (current goals can now be found in the care plan section) Progress towards PT goals: Progressing toward goals    Frequency  Min 3X/week    PT Plan Current plan remains appropriate    Co-evaluation             End of Session   Activity Tolerance: Patient tolerated treatment well Patient left: in bed;with call bell/phone within reach;with bed alarm set     Time: 1311-1331 PT Time Calculation (min) (ACUTE ONLY): 20 min  Charges:  $Gait Training: 8-22 mins                    G Codes:      Atalie Oros,KATHrine E 12-31-15, 3:20 PM Carmelia Bake, PT, DPT 2015/12/31 Pager: [phone]

## 2015-12-26 NOTE — Clinical Social Work Note (Signed)
Clinical Social Work Assessment  Patient Details  Name: Emily Moore MRN: [id number] Date of Birth: [birth year]  Date of referral:  12/26/15               Reason for consult:  Facility Placement                Permission sought to share information with:  Chartered certified accountant granted to share information::  Yes, Verbal Permission Granted  Name::        Agency::     Relationship::     Contact Information:     Housing/Transportation Living arrangements for the past 2 months:  Single Family Home Source of Information:  Patient, Adult Children Patient Interpreter Needed:  None Criminal Activity/Legal Involvement Pertinent to Current Situation/Hospitalization:  No - Comment as needed Significant Relationships:  Adult Children Lives with:  Adult Children Do you feel safe going back to the place where you live?  No Need for family participation in patient care:  Yes (Comment)  Care giving concerns:  CSW received consult from [facility], Cookie that patient's daughter-in-law was requesting information on SNFs.    Social Worker assessment / plan:  CSW met with patient & daughter-in-law at bedside re: discharge planning. Patient was able to ambulate 75' min assist with PT yesterday - therefore, unsure whether [facility] Medicare would authorize for SNF placement. CSW sent information out to [facility] and will provide bed offers.   Employment status:  Retired Nurse, adult PT Recommendations:  Home with [place], 24 Hour Supervision Information / Referral to community resources:  [place]  Patient/Family's Response to care:  Patient's daughter-in-law informed CSW that her husband (patient's son) had moved her into their home about 2 weeks ago and thought they would be able to take her home again but daughter-in-law does not feel 100% comfortable at this time.   Patient/Family's Understanding of and Emotional Response to  Diagnosis, Current Treatment, and Prognosis:  Patient's daughter-in-law is waiting to hear from Dr. Marin Olp & Palliative Care Consult. Daughter feels that speaking with [facility] & palliative care would clarify & help with direction.   Emotional Assessment Appearance:  Appears stated age Attitude/Demeanor/Rapport:    Affect (typically observed):    Orientation:  Oriented to Self, Oriented to Place, Oriented to  Time, Oriented to Situation Alcohol / Substance use:    Psych involvement (Current and /or in the community):     Discharge Needs  Concerns to be addressed:    Readmission within the last 30 days:    Current discharge risk:    Barriers to Discharge:      Standley Brooking, LCSW 12/26/2015, 10:50 AM

## 2015-12-26 NOTE — NC FL2 (Signed)
[place] LEVEL OF CARE SCREENING TOOL     IDENTIFICATION  Patient Name: Emily Moore Birthdate: [birth year] Sex: female Admission Date (Current Location): 12/24/2015  [facility] and [place] Number:  Herbalist and Address:  [facility],  [place] [address] [place]      Provider Number: [id number]  Attending Physician Name and Address:  Cristal Ford, DO  Relative Name and Phone Number:       Current Level of Care: Hospital Recommended Level of Care: [place] Prior Approval Number:    Date Approved/Denied:   PASRR Number: [id number]  Discharge Plan: Home    Current Diagnoses: Patient Active Problem List   Diagnosis Date Noted  . Orthostatic hypotension 12/25/2015  . Liver metastases ([place]) 12/25/2015  . Essential hypertension 12/25/2015  . Ovarian mass 12/24/2015  . Pulmonary emphysema ([place]) 12/19/2015  . Malignant neoplasm of left female breast ([place]) 12/19/2015  . Elevated LFTs 12/19/2015  . Alcohol causing toxic effect 12/19/2015  . Frailty 12/19/2015    Orientation RESPIRATION BLADDER Height & Weight     Self, Time, Situation, Place  Normal Continent Weight: 101 lb (45.813 kg) Height:  5' 2.5" (158.8 cm)  BEHAVIORAL SYMPTOMS/MOOD NEUROLOGICAL BOWEL NUTRITION STATUS      Continent Diet (Heart Healthy)  AMBULATORY STATUS COMMUNICATION OF NEEDS Skin   Limited Assist Verbally Surgical wounds (Wound/Incision(OpenorDehisced)05/02/17Other(Comment)HeadPosteriorbleeding)                       Personal Care Assistance Level of Assistance  Bathing, Dressing Bathing Assistance: Limited assistance   Dressing Assistance: Limited assistance     Functional Limitations Info             SPECIAL CARE FACTORS FREQUENCY  PT (By licensed PT), OT (By licensed OT)     PT Frequency: 5 OT Frequency: 5            Contractures      Additional Factors Info  Code Status, Allergies Code Status  Info: DNR Allergies Info: NKDA           Current Medications (12/26/2015):  This is the current hospital active medication list Current Facility-Administered Medications  Medication Dose Route Frequency Provider Last Rate Last Dose  . acetaminophen (TYLENOL) tablet 650 mg  650 mg Oral Q6H PRN Rise Patience, MD       Or  . acetaminophen (TYLENOL) suppository 650 mg  650 mg Rectal Q6H PRN Rise Patience, MD      . anastrozole (ARIMIDEX) tablet 1 mg  1 mg Oral Daily Rise Patience, MD   1 mg at 12/25/15 [id number]  . enoxaparin (LOVENOX) injection 30 mg  30 mg Subcutaneous QHS Rise Patience, MD   30 mg at 12/25/15 2100  . feeding supplement (ENSURE ENLIVE) (ENSURE ENLIVE) liquid 237 mL  237 mL Oral Q24H Maryann Mikhail, DO   237 mL at 12/25/15 1400  . folic acid (FOLVITE) tablet 1 mg  1 mg Oral Daily Rise Patience, MD   1 mg at 12/25/15 [id number]  . gabapentin (NEURONTIN) capsule 300 mg  300 mg Oral Daily Rise Patience, MD   300 mg at 12/25/15 [id number]  . hydrALAZINE (APRESOLINE) injection 10 mg  10 mg Intravenous Q4H PRN Rise Patience, MD      . LORazepam (ATIVAN) tablet 1 mg  1 mg Oral Q6H PRN Rise Patience, MD       Or  .  LORazepam (ATIVAN) injection 1 mg  1 mg Intravenous Q6H PRN Rise Patience, MD      . multivitamin with minerals tablet 1 tablet  1 tablet Oral Daily Rise Patience, MD   1 tablet at 12/25/15 [phone]  . ondansetron (ZOFRAN) tablet 4 mg  4 mg Oral Q6H PRN Rise Patience, MD       Or  . ondansetron [facility]) injection 4 mg  4 mg Intravenous Q6H PRN Rise Patience, MD      . thiamine (VITAMIN B-1) tablet 100 mg  100 mg Oral Daily Rise Patience, MD   100 mg at 12/25/15 [id number]   Or  . thiamine (B-1) injection 100 mg  100 mg Intravenous Daily Rise Patience, MD         Discharge Medications: Please see discharge summary for a list of discharge medications.  Relevant Imaging Results:  Relevant Lab  Results:   Additional Information SSN: [id number]  Standley Brooking, LCSW

## 2015-12-26 NOTE — Care Management Obs Status (Signed)
[place] NOTIFICATION   Patient Details  Name: Emily Moore MRN: [id number] Date of Birth: 07-Sep-1929   Medicare Observation Status Notification Given:  Yes    McGibboneyOletta Darter, RN 12/26/2015, 8:48 AM

## 2015-12-26 NOTE — Progress Notes (Signed)
LCSW covering for unit CSW this afternoon attempted to follow up with daughter in law: [name] with regards to plan for discharge/disposition.  Patient was recommended SNF however patient has improved in mobility with walking around 117ft. There are only 2 bed offers, but being that patient is walking distance, most likely insurance will deny and recommend HH.  Message was left for daughter in law with regards to patient's most recent PT treatment note and update regarding bed offers/possibity of going home.  Will follow up if family calls back today or tomorrow AM.  Lane Hacker, MSW Clinical Social Work: [facility] [employer] [phone]

## 2015-12-26 NOTE — Clinical Social Work Placement (Signed)
   CLINICAL SOCIAL WORK PLACEMENT  NOTE  Date:  12/26/2015  Patient Details  Name: Emily Moore MRN: [id number] Date of Birth: 1930/05/04  Clinical Social Work is seeking post-discharge placement for this patient at the [place] level of care (*CSW will initial, date and re-position this form in  chart as items are completed):  Yes   Patient/family provided with [facility] Work Department's list of facilities offering this level of care within the geographic area requested by the patient (or if unable, by the patient's family).  Yes   Patient/family informed of their freedom to choose among providers that offer the needed level of care, that participate in Medicare, Medicaid or managed care program needed by the patient, have an available bed and are willing to accept the patient.  Yes   Patient/family informed of [facility]'s ownership interest in [facility] and [facility], as well as of the fact that they are under no obligation to receive care at these facilities.  PASRR submitted to EDS on 12/26/15     PASRR number received on 12/26/15     Existing PASRR number confirmed on       FL2 transmitted to all facilities in geographic area requested by pt/family on 12/26/15     FL2 transmitted to all facilities within larger geographic area on       Patient informed that his/her managed care company has contracts with or will negotiate with certain facilities, including the following:            Patient/family informed of bed offers received.  Patient chooses bed at       Physician recommends and patient chooses bed at      Patient to be transferred to   on  .  Patient to be transferred to facility by       Patient family notified on   of transfer.  Name of family member notified:        PHYSICIAN       Additional Comment:    _______________________________________________ Standley Brooking, LCSW 12/26/2015, 10:58 AM

## 2015-12-27 DIAGNOSIS — C561 Malignant neoplasm of right ovary: Secondary | ICD-10-CM

## 2015-12-27 DIAGNOSIS — N839 Noninflammatory disorder of ovary, fallopian tube and broad ligament, unspecified: Secondary | ICD-10-CM | POA: Diagnosis not present

## 2015-12-27 DIAGNOSIS — I10 Essential (primary) hypertension: Secondary | ICD-10-CM | POA: Diagnosis not present

## 2015-12-27 DIAGNOSIS — Z515 Encounter for palliative care: Secondary | ICD-10-CM | POA: Insufficient documentation

## 2015-12-27 DIAGNOSIS — K5900 Constipation, unspecified: Secondary | ICD-10-CM | POA: Diagnosis not present

## 2015-12-27 DIAGNOSIS — I951 Orthostatic hypotension: Secondary | ICD-10-CM | POA: Diagnosis not present

## 2015-12-27 DIAGNOSIS — R0602 Shortness of breath: Secondary | ICD-10-CM

## 2015-12-27 DIAGNOSIS — R109 Unspecified abdominal pain: Secondary | ICD-10-CM

## 2015-12-27 DIAGNOSIS — Z7189 Other specified counseling: Secondary | ICD-10-CM | POA: Insufficient documentation

## 2015-12-27 LAB — CANCER ANTIGEN 27.29: CA 27.29: 7001.6 U/mL — ABNORMAL HIGH (ref 0.0–38.6)

## 2015-12-27 LAB — CA 125: CA 125: 127.6 U/mL — AB (ref 0.0–38.1)

## 2015-12-27 MED ORDER — FENTANYL 12 MCG/HR TD PT72
12.5000 ug | MEDICATED_PATCH | TRANSDERMAL | Status: DC
  Administered 2015-12-27: 12.5 ug via TRANSDERMAL
  Filled 2015-12-27: qty 1

## 2015-12-27 MED ORDER — GLYCERIN (LAXATIVE) 2.1 G RE SUPP
1.0000 | Freq: Once | RECTAL | Status: AC
  Administered 2015-12-27: 1 via RECTAL
  Filled 2015-12-27: qty 1

## 2015-12-27 MED ORDER — PB-HYOSCY-ATROPINE-SCOPOLAMINE 16.2 MG/5ML PO ELIX
5.0000 mL | ORAL_SOLUTION | Freq: Three times a day (TID) | ORAL | Status: DC
  Administered 2015-12-27 – 2015-12-28 (×4): 16.2 mg via ORAL
  Filled 2015-12-27 (×6): qty 5

## 2015-12-27 MED ORDER — SENNOSIDES-DOCUSATE SODIUM 8.6-50 MG PO TABS
2.0000 | ORAL_TABLET | Freq: Every day | ORAL | Status: DC
  Filled 2015-12-27: qty 2

## 2015-12-27 MED ORDER — MORPHINE SULFATE (CONCENTRATE) 10 MG/0.5ML PO SOLN
10.0000 mg | ORAL | Status: DC | PRN
Start: 2015-12-27 — End: 2015-12-28

## 2015-12-27 MED ORDER — DIPHENHYDRAMINE-ZINC ACETATE 2-0.1 % EX CREA
TOPICAL_CREAM | Freq: Three times a day (TID) | CUTANEOUS | Status: DC | PRN
  Administered 2015-12-27: 17:00:00 via TOPICAL
  Filled 2015-12-27: qty 28

## 2015-12-27 MED ORDER — PB-HYOSCY-ATROPINE-SCOPOLAMINE 16.2 MG PO TABS
1.0000 | ORAL_TABLET | Freq: Three times a day (TID) | ORAL | Status: DC
  Filled 2015-12-27 (×3): qty 1

## 2015-12-27 MED ORDER — GLYCERIN (LAXATIVE) 2.1 G RE SUPP
1.0000 | Freq: Every day | RECTAL | Status: DC | PRN
  Filled 2015-12-27: qty 1

## 2015-12-27 NOTE — Progress Notes (Addendum)
CSW received consult for residential hospice. CSW called daughter-in-law, [name] (ph#: [phone]) to confirm that [facility] would be their first choice. CSW made referral to Becky Augusta, [place] - awaiting call back re: bed availability/eligibility.    Raynaldo Opitz, [facility] Clinical Social Worker cell #: [phone]

## 2015-12-27 NOTE — Progress Notes (Signed)
[facility] - [id number]   [facility] RN Liason visit  Received request from Raynaldo Opitz, [place] for family interest in [facility] with request for transfer as soon as possible.   Chart reviewed. Met with patient and family to confirm interest and explain services.   There is not a bed available today,however we are able to offer patient a bed tomorrow, 12/28/15.  Patient and family are agreeable to transfer on Friday.    LCSW made aware.    Registration paperwork has been completed.  Dr. Burney Gauze to manage care while at [facility] per family request.    Please fax discharge summary to [phone].   RN please call report to [phone].   Please arrange for transport for patient to arrive before noon on Friday.  Thank you!   Mickie Kay, [facility] Liason  [phone]

## 2015-12-27 NOTE — Progress Notes (Signed)
I had a long talk with Emily Moore in her family this morning area and they are all very nice.  I think it is apparent that Emily Moore probably has ovarian cancer. She has a large right ovarian mass. Her CEA-125 is elevated. She also has an elevated CEA.  I think the most importantly have a prealbumin. This is only 2.8. I think this is incredibly right nostril and probably indicates that she has maybe 3 weeks to live. She's lost weight. She is not eating all that much.  Pain control will be important. I will put her on a fentanyl patch. We will also try some concentrated oral morphine.  I believe that the best option for her will be to get her to [facility]. I think she would do very well there. I think it will help her family out quite a bit. I think she is 24-hour care right now. Given her limited prognosis, I really think that [facility] would provide a lot of support for her family.  She has some abdominal distention. There is some discomfort in the right quadrant. I'm sure this is from her liver.  She has some bleeding in the back of her head. She has a contusion back there from falling. She does not need to be on anticoagulation.  She has some abdominal distention. She feels that she has a lot of gas. I'll try some Donnatal to see this can help.  She understands very well the terminal nature of her illness. Again, she just wants respect and dignity. I totally understand that. I want to make sure that her comfort is a top priority.  Hopefully, there will be a bed at [facility]. If not, I would think that should only take a couple days that a bed will open up.  I spent about 35 minutes with Emily Moore in her family. Again there are all very nice. They have a very strong faith.  Lum Keas  2 Timothy 4:16-18

## 2015-12-27 NOTE — Progress Notes (Signed)
PROGRESS NOTE    Emily Moore  [id number] DOB: [birth year] DOA: 12/24/2015 PCP: Lamar Blinks, MD  Outpatient Specialists:   Brief Narrative:  On 12/25/2015 by Dr. Gean Birchwood Carloyn Schulke is a 82 y.o. female with medical history significant of breast cancer status post mastectomy present on anastrozole, hypertension, abuse who has recently moved to her son's house from [place] was brought to the ER after patient was found to be increasingly weak and sonogram shows liver mass. Patient was found to have elevated LFTs. Sonogram was done. Patient also has been in a week and has had falls and had come to the ER a week ago and at that time CT head did not show anything acute. As per the patient's daughter over the last 3 days patient has been having recurrent episodes of diarrhea. Patient has been on glucose of antibiotics for lower extremity swelling, possible cellulitis and also UTI. Denies any nausea vomiting but did feel abdominal distention for which patient had CT scan of the chest and abdomen which at this time shows metastatic lesions to the liver and bone probably from breast cancer or new ovarian mass concerning for ovarian carcinoma. Patient has been admitted for further management.  Assessment & Plan   Orthostatic hypotension -Patient found to be hypotensive in the emergency department -Resolved with IV hydration -Likely secondary to dehydration from diarrhea and poor oral intake  Dyspnea -Secondary to unknown cause, seems to have Resolved -Echocardiogram: EF [id number], grade 1 diastolic dysfunction CT chest with contrast: Small right crural effusion, mild right lower lobe atelectasis, lungs are diffusely hyperexpanded. 1.1 cm irregular nodule with adjacent pleural tethering in the right lower lobe. -will continue to monitor  Diarrhea -Patient has not had any further loose bowel movements. She actually had a formed stool during hospitalization  -Cannot some sample for C.  Difficile. -If diarrhea occurs again, will obtain sample  Metastatic lesions to liver and bone concerning for breast cancer or ovarian cancer -Oncology consulted and appreciated -CA 19-9: 88, CEA 642 -Oncology does not believe patient would be a candidate for systemic therapy.  -Spoke with patient and daughter at bedside, would like more comfort care approach -Palliative care consulted and appreciated. -Plan for discharge to residential hospice.  Acute renal injury -Likely secondary to dehydration from diarrhea as well as poor oral intake -ACE inhibitor held -Creatinine was 1.29 upon admission, currently 0.81 -Continue to monitor BMP  Alcohol abuse -Currently not having withdrawal symptoms or DTs -Continue CIWA protocol  Normocytic Anemia -Appears to be stable, continue to monitor CBC  Essential hypertension -Medications held, continue hydralazine as needed  Generalized weakness -likely secondary to the above -PT recommended home health -Daughter is worried that patient may need more assistance and care than she can provide -CM/SW consulted -Plan is for discharge to residential hospice.  DVT Prophylaxis  Lovenox  Code Status: DNR  Family Communication: Family at bedside  Disposition Plan: Admitted. Will likely discharge on 12/28/2015 to residential hospice.  Consultants Oncology Palliative care  Procedures  Echocardiogram  Antibiotics   Anti-infectives    None      Subjective:   Emily Moore seen and examined today. Patient feels better today. Has no complaints. Patient did have some abdominal pain yesterday however the pain medication did help. She has been able to rest more comfortably at night.  Denies any chest pain or shortness of breath, nausea or vomiting.   Objective:   Filed Vitals:   12/26/15 [id number] 12/26/15 1538 12/26/15 2108 12/27/15 [id number]  BP: 125/69 111/63 128/71 138/66  Pulse: 77 81 76 78  Temp: 97.8 F (36.6 C) 98.5 F (36.9 C) 99.7 F  (37.6 C) 98.9 F (37.2 C)  TempSrc: Oral Oral Oral Oral  Resp: 20 20 18 18   Height:      Weight: 45.813 kg (101 lb)     SpO2: 92% 95% 93% 93%    Intake/Output Summary (Last 24 hours) at 12/27/15 1244 Last data filed at 12/26/15 1538  Gross per 24 hour  Intake    120 ml  Output      0 ml  Net    120 ml   Filed Weights   12/25/15 0044 12/26/15 0607  Weight: 43.092 kg (95 lb) 45.813 kg (101 lb)    Exam (no change)  General: Well developed, well nourished, no distress  HEENT: NCAT,  mucous membranes moist  Cardiovascular: S1 S2 auscultated, 2/6SEM, RRR  Respiratory: Clear to auscultation bilaterally   Abdomen: Soft, nontender, nondistended, + bowel sounds  Extremities: warm dry without cyanosis clubbing or edema. RLE wrapped.  Neuro: AAOx3, nonfocal  Psych: Normal affect and demeanor, pleasant   Data Reviewed: I have personally reviewed following labs and imaging studies  CBC:  Recent Labs Lab 12/24/15 1812 12/25/15 0502  WBC 12.8* 11.2*  NEUTROABS 9.6*  --   HGB 11.3* 10.9*  HCT 34.5* 32.2*  MCV 82.9 82.4  PLT 325 [id number]   Basic Metabolic Panel:  Recent Labs Lab 12/24/15 1812 12/25/15 0502  NA 129*  --   K 4.4  --   CL 98*  --   CO2 22  --   GLUCOSE 119*  --   BUN 30*  --   CREATININE 1.29* 0.81  CALCIUM 8.9  --    GFR: Estimated Creatinine Clearance: 37.4 mL/min (by C-G formula based on Cr of 0.81). Liver Function Tests:  Recent Labs Lab 12/24/15 1812 12/25/15 0502  AST 74* 59*  ALT 44 35  ALKPHOS 240* 213*  BILITOT 0.9 0.9  PROT 6.8 6.0*  ALBUMIN 2.8* 2.5*    Recent Labs Lab 12/24/15 1812  LIPASE 43    Recent Labs Lab 12/24/15 1812  AMMONIA 35   Coagulation Profile: No results for input(s): INR, PROTIME in the last 168 hours. Cardiac Enzymes: No results for input(s): CKTOTAL, CKMB, CKMBINDEX, TROPONINI in the last 168 hours. BNP (last 3 results) No results for input(s): PROBNP in the last 8760 hours. HbA1C: No  results for input(s): HGBA1C in the last 72 hours. CBG: No results for input(s): GLUCAP in the last 168 hours. Lipid Profile: No results for input(s): CHOL, HDL, LDLCALC, TRIG, CHOLHDL, LDLDIRECT in the last 72 hours. Thyroid Function Tests: No results for input(s): TSH, T4TOTAL, FREET4, T3FREE, THYROIDAB in the last 72 hours. Anemia Panel: No results for input(s): VITAMINB12, FOLATE, FERRITIN, TIBC, IRON, RETICCTPCT in the last 72 hours. Urine analysis:    Component Value Date/Time   COLORURINE YELLOW 12/24/2015 2350   APPEARANCEUR CLEAR 12/24/2015 2350   LABSPEC 1.046* 12/24/2015 2350   PHURINE 5.5 12/24/2015 2350   GLUCOSEU NEGATIVE 12/24/2015 2350   HGBUR NEGATIVE 12/24/2015 2350   BILIRUBINUR NEGATIVE 12/24/2015 2350   KETONESUR NEGATIVE 12/24/2015 2350   PROTEINUR NEGATIVE 12/24/2015 2350   NITRITE NEGATIVE 12/24/2015 2350   LEUKOCYTESUR NEGATIVE 12/24/2015 2350   Sepsis Labs: @LABRCNTIP (procalcitonin:4,lacticidven:4)  )No results found for this or any previous visit (from the past 240 hour(s)).    Radiology Studies: No results found.   Scheduled Meds: . belladonna-PHENObarbital  5 mL Oral TID  . feeding supplement (ENSURE ENLIVE)  237 mL Oral Q24H  . fentaNYL  12.5 mcg Transdermal Q72H  . gabapentin  300 mg Oral Daily  . senna-docusate  2 tablet Oral QHS   Continuous Infusions:     LOS: 3 days   Time Spent in minutes   30 minutes  Emily Moore D.O. on 12/27/2015 at 12:44 PM  Between 7am to 7pm - Pager - [phone]  After 7pm go to [email or web address] - password [email or web address]  And look for the night coverage person covering for me after hours  [facility] Office  [phone]

## 2015-12-27 NOTE — Consult Note (Signed)
Consultation Note Date: 12/27/2015   Patient Name: Emily Moore  DOB: 1930-06-07  MRN: [id number]  Age / Sex: 82 y.o., female  PCP: Darreld Mclean, MD Referring Physician: Cristal Ford, DO  Reason for Consultation: Establishing goals of care, Inpatient hospice referral and Terminal Care  HPI/Patient Profile: 82 y.o. female  with past medical history of breast cancer, COPD, heart murmur admitted on 12/24/2015 with weakness, abdominal pain, weight loss. CT scan of abdomen pelvis shows numerous hepatic masses, and a massive right ovarian mass.  Her prealbumin is 2.8.  Dr. Marin Olp has seen the patient and feels she has only 2-3 weeks left to live.  Patient was regularly consuming cigarettes and alcohol prior to admission - but now does not seem to crave either of them.  Clinical Assessment and Goals of Care:  I met with the patient, and her two children at bedside.  Ms. Emily Moore was living independently in [place], [place].  At [place] her daughter, Emily Moore, noticed she seemed weak and was having great difficulty getting around.  She simply stayed in the recliner chair most of the day.  In the last month Ms. Santore has declined very quickly and has been falling frequently when trying to stand.  She complains of abdominal distension and tightness.  She has been constipated.  She says she even feels some relief just by passing gas.  She seems at peace now that she has her diagnosis.  She smiles and says, its in God's hands.      SUMMARY OF RECOMMENDATIONS    Full Comfort Care.  Please transfer to residential hospice ASAP.  Dr Marin Olp has ordered appropriate comfort medications.  Patient will need both Opiates and benzodiazepines.  Family will need support & grief counseling.  Patient is a Furniture conservator/restorer.  Her minister has visited her in the hospital.  Code Status/Advance Care Planning:  DNR    Symptom  Management:   Donnatal TID, Fentanyl patch, and morphine solution PRN for pain  Continue ativan 1 mg q 4 hours PRN anxiety at discharge  Continue glycerin suppositories and Senna/s  Regular diet as tolerated.  Palliative Prophylaxis:   Delirium Protocol and Frequent Pain Assessment  Psycho-social/Spiritual:   Desire for further Chaplaincy support::  No  Additional Recommendations: Caregiving  Support/Resources and Grief/Bereavement Support  Prognosis:   < 4 weeks - given widely metastatic cancer (uncertain if this is ovarian, breast or colon), anorexia, inability to stand without significant assistance, prealbumin 2.8  Discharge Planning: Hospice facility      Primary Diagnoses: Present on Admission:  . Ovarian mass . Essential hypertension  I have reviewed the medical record, interviewed the patient and family, and examined the patient. The following aspects are pertinent.  Past Medical History  Diagnosis Date  . Cancer ([place])   . Hypertension   . Heart murmur   . Arthritis   . Cataract   . COPD (chronic obstructive pulmonary disease) ([place])   . Depression   . Osteoporosis    Social History  Social History  . Marital Status: Widowed    Spouse Name: N/A  . Number of Children: N/A  . Years of Education: N/A   Social History Main Topics  . Smoking status: Current Every Day Smoker -- 1.00 packs/day    Types: Cigarettes  . Smokeless tobacco: None  . Alcohol Use: Yes     Comment: Vodka.  . Drug Use: No  . Sexual Activity: Not Asked   Other Topics Concern  . None   Social History Narrative   Family History  Problem Relation Age of Onset  . Heart disease Mother   . Cancer Father    Scheduled Meds: . belladonna-PHENObarbital  5 mL Oral TID  . feeding supplement (ENSURE ENLIVE)  237 mL Oral Q24H  . fentaNYL  12.5 mcg Transdermal Q72H  . gabapentin  300 mg Oral Daily  . Glycerin (Adult)  1 suppository Rectal Once  . senna-docusate  2 tablet Oral QHS    Continuous Infusions:  PRN Meds:.acetaminophen **OR** acetaminophen, Glycerin (Adult), LORazepam **OR** LORazepam, morphine injection, morphine CONCENTRATE, ondansetron **OR** ondansetron (ZOFRAN) IV Medications Prior to Admission:  Prior to Admission medications   Medication Sig Start Date End Date Taking? Authorizing Provider  anastrozole (ARIMIDEX) 1 MG tablet Take 1 mg by mouth daily.   Yes Historical Provider, MD  gabapentin (NEURONTIN) 300 MG capsule Take 300 mg by mouth daily.   Yes Historical Provider, MD  quinapril (ACCUPRIL) 5 MG tablet Take 5 mg by mouth daily.    Yes Historical Provider, MD  Sulfamethoxazole-Trimethoprim (BACTRIM DS PO) Take 1 tablet by mouth 2 (two) times daily. ABT Start Date 12/12/15 & End Date 12/26/15   Yes Historical Provider, MD   No Known Allergies Review of Systems:  Reports abdominal pain, distention, frequent falls, anorexia, constipation.   Denies odynaphagia, chest pain, SOB, dysuria  Physical Exam  Very pleasant, thin female, NAD CV RRR Resp:  No distress Abd:  Distended, firm Extremities:  Able to move all 4  Vital Signs: BP 138/66 mmHg  Pulse 78  Temp(Src) 98.9 F (37.2 C) (Oral)  Resp 18  Ht 5' 2.5" (1.588 m)  Wt 45.813 kg (101 lb)  BMI 18.17 kg/m2  SpO2 93% Pain Assessment: No/denies pain   Pain Score: 0-No pain   SpO2: SpO2: 93 % O2 Device:SpO2: 93 % O2 Flow Rate: .   IO: Intake/output summary:  Intake/Output Summary (Last 24 hours) at 12/27/15 3149 Last data filed at 12/26/15 1538  Gross per 24 hour  Intake    120 ml  Output      0 ml  Net    120 ml    LBM: Last BM Date: 12/25/15 Baseline Weight: Weight: 43.092 kg (95 lb) Most recent weight: Weight: 45.813 kg (101 lb)     Palliative Assessment/Data:   Flowsheet Rows        Most Recent Value   Intake Tab    Referral Department  Hospitalist   Unit at Time of Referral  Med/Surg Unit   Palliative Care Primary Diagnosis  Cancer   Palliative Care Type  New  Palliative care   Reason for referral  End of Life Care Assistance   Date of Admission  12/24/15   Date first seen by Palliative Care  12/27/15   Clinical Assessment    Palliative Performance Scale Score  30%   Psychosocial & Spiritual Assessment    Palliative Care Outcomes       Time In: 8:00 Time Out: 8:55 Time Total:  55 min. Greater than 50%  of this time was spent counseling and coordinating care related to the above assessment and plan.  Signed by: Imogene Burn, PA-C Palliative Medicine Text page via [facility] [email or web address], password [email or web address]    Please contact Palliative Medicine Team phone at [phone] for questions and concerns.  For individual provider: See Shea Evans

## 2015-12-28 DIAGNOSIS — Z515 Encounter for palliative care: Secondary | ICD-10-CM | POA: Diagnosis not present

## 2015-12-28 DIAGNOSIS — N839 Noninflammatory disorder of ovary, fallopian tube and broad ligament, unspecified: Secondary | ICD-10-CM | POA: Diagnosis not present

## 2015-12-28 DIAGNOSIS — K5900 Constipation, unspecified: Secondary | ICD-10-CM | POA: Diagnosis not present

## 2015-12-28 DIAGNOSIS — I951 Orthostatic hypotension: Secondary | ICD-10-CM | POA: Diagnosis not present

## 2015-12-28 NOTE — Progress Notes (Signed)
Pt discharging to [facility]. A very pleasant pt and family.

## 2015-12-28 NOTE — Discharge Summary (Signed)
Physician Discharge Summary  Emily Moore [id number] DOB: August 06, 1930 DOA: 12/24/2015  PCP: Lamar Blinks, MD  Admit date: 12/24/2015 Discharge date: 12/28/2015  Time spent: 45 minutes  Recommendations for Outpatient Follow-up:  Patient will be discharged to [facility].  Patient will need to follow up with primary care provider within one week of discharge.  Patient should continue medications as prescribed.  Patient should follow a regular diet.   Discharge Diagnoses:  Orthostatic hypotension Dyspnea Diarrhea Metastatic lesions to liver and bone concerning for breast cancer versus ovarian cancer Acute kidney injury Alcohol abuse Normocytic anemia  hypertension Generalized weakness  Discharge Condition: Stable  Diet recommendation: regular  Filed Weights   12/25/15 0044 12/26/15 0607 12/28/15 0520  Weight: 43.092 kg (95 lb) 45.813 kg (101 lb) 44.951 kg (99 lb 1.6 oz)    History of present illness:  On 12/25/2015 by Dr. Gean Birchwood Emily Moore is a 82 y.o. female with medical history significant of breast cancer status post mastectomy present on anastrozole, hypertension, abuse who has recently moved to her son's house from [place] was brought to the ER after patient was found to be increasingly weak and sonogram shows liver mass. Patient was found to have elevated LFTs. Sonogram was done. Patient also has been in a week and has had falls and had come to the ER a week ago and at that time CT head did not show anything acute. As per the patient's daughter over the last 3 days patient has been having recurrent episodes of diarrhea. Patient has been on glucose of antibiotics for lower extremity swelling, possible cellulitis and also UTI. Denies any nausea vomiting but did feel abdominal distention for which patient had CT scan of the chest and abdomen which at this time shows metastatic lesions to the liver and bone probably from breast cancer or new ovarian mass concerning for  ovarian carcinoma. Patient has been admitted for further management.  Hospital Course:  Orthostatic hypotension -Patient found to be hypotensive in the emergency department -Resolved with IV hydration -Likely secondary to dehydration from diarrhea and poor oral intake  Dyspnea -Secondary to unknown cause, seems to have Resolved -Echocardiogram: EF [id number], grade 1 diastolic dysfunction CT chest with contrast: Small right crural effusion, mild right lower lobe atelectasis, lungs are diffusely hyperexpanded. 1.1 cm irregular nodule with adjacent pleural tethering in the right lower lobe.  Diarrhea -Patient has not had any further loose bowel movements. She actually had a formed stool during hospitalization  -Cannot some sample for C. Difficile. -No further diarrhea during hospitalization  Metastatic lesions to liver and bone concerning for breast cancer or ovarian cancer -Oncology consulted and appreciated -CA 19-9: 88, CEA 642 -Oncology does not believe patient would be a candidate for systemic therapy.  -Spoke with patient and daughter at bedside, would like more comfort care approach -Palliative care consulted and appreciated. -Plan for discharge to residential hospice.  Acute kidney injury -Likely secondary to dehydration from diarrhea as well as poor oral intake -ACE inhibitor held -Creatinine was 1.29 upon admission, trended down to 0.81  Alcohol abuse -Currently not having withdrawal symptoms or DTs -Was placed on CIWA protocol  Normocytic Anemia -Appears to be stable  Essential hypertension -Medications held. BP has been normotensive during hospitalization.  Generalized weakness -likely secondary to the above -PT recommended home health -Daughter is worried that patient may need more assistance and care than she can provide -CM/SW consulted -Plan to discharge to residential hospice.  Consultants Oncology Palliative care  Procedures  Echocardiogram  Discharge Exam: Filed Vitals:   12/27/15 2116 12/28/15 0520  BP: 145/61 128/67  Pulse: 71 67  Temp: 98.3 F (36.8 C) 98.1 F (36.7 C)  Resp: 20 18   Exam  General: Well developed, well nourished, no distress  HEENT: NCAT, mucous membranes moist  Cardiovascular: S1 S2 auscultated, 2/6SEM, RRR  Respiratory: Clear to auscultation bilaterally  Abdomen: Soft, nontender, nondistended, + bowel sounds  Extremities: warm dry without cyanosis clubbing or edema. RLE wrapped.  Neuro: AAOx3, nonfocal  Psych: Normal affect and demeanor, pleasant  Discharge Instructions      Discharge Instructions    Discharge instructions    Complete by:  As directed   Patient will be discharged to [facility].  Patient will need to follow up with primary care provider within one week of discharge.  Patient should continue medications as prescribed.  Patient should follow a regular diet.            Medication List    STOP taking these medications        anastrozole 1 MG tablet  Commonly known as:  ARIMIDEX     BACTRIM DS PO     quinapril 5 MG tablet  Commonly known as:  ACCUPRIL      TAKE these medications        gabapentin 300 MG capsule  Commonly known as:  NEURONTIN  Take 300 mg by mouth daily.       No Known Allergies    The results of significant diagnostics from this hospitalization (including imaging, microbiology, ancillary and laboratory) are listed below for reference.    Significant Diagnostic Studies: Dg Chest 2 View  12/14/2015  CLINICAL DATA:  Shortness of Breath EXAM: CHEST  2 VIEW COMPARISON:  None. FINDINGS: There is mild elevation of the right hemidiaphragm. There is no apparent edema or consolidation. The heart size and pulmonary vascularity are normal. No adenopathy. No bone lesions. IMPRESSION: Elevation right hemidiaphragm.  No edema or consolidation. Electronically Signed   By: Lowella Grip III M.D.   On: 12/14/2015 13:38   Ct  Head Wo Contrast  12/17/2015  CLINICAL DATA:  Fall today, hit back of head on concrete. Large hematoma well laceration. EXAM: CT HEAD WITHOUT CONTRAST CT CERVICAL SPINE WITHOUT CONTRAST TECHNIQUE: Multidetector CT imaging of the head and cervical spine was performed following the standard protocol without intravenous contrast. Multiplanar CT image reconstructions of the cervical spine were also generated. COMPARISON:  None. FINDINGS: CT HEAD FINDINGS Posterior scalp hematoma. No underlying calvarial abnormality. Old infarct in the left frontal lobe. There is atrophy and chronic small vessel disease changes. No acute intracranial abnormality. Specifically, no hemorrhage, hydrocephalus, mass lesion, acute infarction, or significant intracranial injury. No acute calvarial abnormality. Visualized paranasal sinuses and mastoids clear. Orbital soft tissues unremarkable. CT CERVICAL SPINE FINDINGS Normal alignment. Prevertebral soft tissues are normal. Diffuse degenerative disc and facet disease. No fracture. No epidural or paraspinal hematoma. IMPRESSION: No acute intracranial abnormality. Atrophy, chronic microvascular disease. Old left frontal infarct. Cervical spondylosis.  No acute bony abnormality. Electronically Signed   By: Rolm Baptise M.D.   On: 12/17/2015 12:52   Ct Chest W Contrast  12/24/2015  CLINICAL DATA:  Several recent falls. Clinical concern for orthostatics hypotension. Recently diagnosed liver metastases or multifocal hepatocellular carcinoma. History of alcohol abuse and left breast cancer. EXAM: CT CHEST, ABDOMEN, AND PELVIS WITH CONTRAST TECHNIQUE: Multidetector CT imaging of the chest, abdomen and pelvis was performed following the standard protocol  during bolus administration of intravenous contrast. CONTRAST:  47mL ISOVUE-300 IOPAMIDOL (ISOVUE-300) INJECTION 61% COMPARISON:  Right upper quadrant abdomen ultrasound obtained earlier today. FINDINGS: CT CHEST FINDINGS Mediastinum/Lymph Nodes:  Dense atheromatous coronary artery calcifications. No enlarged lymph nodes. No intravascular thrombi visualized. Lungs/Pleura: Small right pleural effusion. 1.1 cm irregular nodule with adjacent pleural tethering in the anterior right lower lobe on image number 43 of series 10. 1.1 cm mildly irregular nodule in the left lower lobe on image number 57 of series 10. Mild right lower lobe atelectasis. The lungs are diffusely hyperexpanded with right lower lobe bullous changes and diffuse peribronchial thickening. Musculoskeletal: Sclerotic lesion in the superior aspect of the T4 vertebral body. Sclerotic lesion in the posterior aspect of the T9 vertebral body. Sclerotic lesions in the T12 vertebral body. Approximately 20% T12 superior endplate compression deformity with sclerosis. No acute fracture lines. No bony retropulsion. Postmastectomy changes on the left. CT ABDOMEN PELVIS FINDINGS Hepatobiliary: Multiple poorly defined liver masses. This is more pronounced and slightly more confluent in the superior aspect of the liver on the right. Lobulated liver contours. The liver is also enlarged. Mildly prominent lateral segment left lobe of the liver and caudate lobe. Gallbladder Phrygian cap. Pancreas: No mass, inflammatory changes, or other significant abnormality. Spleen: Within normal limits in size and appearance. Adrenals/Urinary Tract: The right kidney is inferiorly displaced by the enlarged liver. Otherwise, normal appearing kidneys, ureters and urinary bladder. No urinary tract calculi or hydronephrosis. Stomach/Bowel: Small sliding hiatal hernia. Multiple diverticula throughout the colon. No small bowel abnormalities. Normal appearing appendix. Vascular/Lymphatic: Dense atheromatous arterial calcifications. No enlarged lymph nodes. Reproductive: Mildly enlarged left ovary. Large multi-septated cystic and solid right ovarian mass. On image number 90 of series 8, this measures 16.2 x 10.0 cm in maximum dimensions.  On sagittal image number 76, this measures 12.3 cm in length. Surgically absent uterus. Other: No free peritoneal fluid. Musculoskeletal: Rounded sclerotic focus in the superior, posterior aspect of the L1 vertebral body. Disc space narrowing, spur formation and discogenic sclerosis at multiple levels. IMPRESSION: 1. 16.2 x 12.3 x 10.0 cm cystic and solid right ovarian mass, highly suspicious for a primary ovarian carcinoma. An ovarian metastasis is less likely. 2. Hepatomegaly with multiple masses throughout the liver. These most likely represent metastases from metastatic breast cancer or metastatic ovarian carcinoma. However, multifocal hepatocellular carcinoma remains a possibility given additional changes suggesting the possibility of cirrhosis of the liver with a history of alcohol abuse. 3. Multiple sclerotic bone metastases, compatible with metastatic breast cancer. 4. Extensive colonic diverticulosis. Electronically Signed   By: Claudie Revering M.D.   On: 12/24/2015 20:44   Ct Cervical Spine Wo Contrast  12/17/2015  CLINICAL DATA:  Fall today, hit back of head on concrete. Large hematoma well laceration. EXAM: CT HEAD WITHOUT CONTRAST CT CERVICAL SPINE WITHOUT CONTRAST TECHNIQUE: Multidetector CT imaging of the head and cervical spine was performed following the standard protocol without intravenous contrast. Multiplanar CT image reconstructions of the cervical spine were also generated. COMPARISON:  None. FINDINGS: CT HEAD FINDINGS Posterior scalp hematoma. No underlying calvarial abnormality. Old infarct in the left frontal lobe. There is atrophy and chronic small vessel disease changes. No acute intracranial abnormality. Specifically, no hemorrhage, hydrocephalus, mass lesion, acute infarction, or significant intracranial injury. No acute calvarial abnormality. Visualized paranasal sinuses and mastoids clear. Orbital soft tissues unremarkable. CT CERVICAL SPINE FINDINGS Normal alignment. Prevertebral  soft tissues are normal. Diffuse degenerative disc and facet disease. No fracture. No epidural or  paraspinal hematoma. IMPRESSION: No acute intracranial abnormality. Atrophy, chronic microvascular disease. Old left frontal infarct. Cervical spondylosis.  No acute bony abnormality. Electronically Signed   By: Rolm Baptise M.D.   On: 12/17/2015 12:52   Ct Abdomen Pelvis W Contrast  12/24/2015  CLINICAL DATA:  Several recent falls. Clinical concern for orthostatics hypotension. Recently diagnosed liver metastases or multifocal hepatocellular carcinoma. History of alcohol abuse and left breast cancer. EXAM: CT CHEST, ABDOMEN, AND PELVIS WITH CONTRAST TECHNIQUE: Multidetector CT imaging of the chest, abdomen and pelvis was performed following the standard protocol during bolus administration of intravenous contrast. CONTRAST:  39mL ISOVUE-300 IOPAMIDOL (ISOVUE-300) INJECTION 61% COMPARISON:  Right upper quadrant abdomen ultrasound obtained earlier today. FINDINGS: CT CHEST FINDINGS Mediastinum/Lymph Nodes: Dense atheromatous coronary artery calcifications. No enlarged lymph nodes. No intravascular thrombi visualized. Lungs/Pleura: Small right pleural effusion. 1.1 cm irregular nodule with adjacent pleural tethering in the anterior right lower lobe on image number 43 of series 10. 1.1 cm mildly irregular nodule in the left lower lobe on image number 57 of series 10. Mild right lower lobe atelectasis. The lungs are diffusely hyperexpanded with right lower lobe bullous changes and diffuse peribronchial thickening. Musculoskeletal: Sclerotic lesion in the superior aspect of the T4 vertebral body. Sclerotic lesion in the posterior aspect of the T9 vertebral body. Sclerotic lesions in the T12 vertebral body. Approximately 20% T12 superior endplate compression deformity with sclerosis. No acute fracture lines. No bony retropulsion. Postmastectomy changes on the left. CT ABDOMEN PELVIS FINDINGS Hepatobiliary: Multiple poorly  defined liver masses. This is more pronounced and slightly more confluent in the superior aspect of the liver on the right. Lobulated liver contours. The liver is also enlarged. Mildly prominent lateral segment left lobe of the liver and caudate lobe. Gallbladder Phrygian cap. Pancreas: No mass, inflammatory changes, or other significant abnormality. Spleen: Within normal limits in size and appearance. Adrenals/Urinary Tract: The right kidney is inferiorly displaced by the enlarged liver. Otherwise, normal appearing kidneys, ureters and urinary bladder. No urinary tract calculi or hydronephrosis. Stomach/Bowel: Small sliding hiatal hernia. Multiple diverticula throughout the colon. No small bowel abnormalities. Normal appearing appendix. Vascular/Lymphatic: Dense atheromatous arterial calcifications. No enlarged lymph nodes. Reproductive: Mildly enlarged left ovary. Large multi-septated cystic and solid right ovarian mass. On image number 90 of series 8, this measures 16.2 x 10.0 cm in maximum dimensions. On sagittal image number 76, this measures 12.3 cm in length. Surgically absent uterus. Other: No free peritoneal fluid. Musculoskeletal: Rounded sclerotic focus in the superior, posterior aspect of the L1 vertebral body. Disc space narrowing, spur formation and discogenic sclerosis at multiple levels. IMPRESSION: 1. 16.2 x 12.3 x 10.0 cm cystic and solid right ovarian mass, highly suspicious for a primary ovarian carcinoma. An ovarian metastasis is less likely. 2. Hepatomegaly with multiple masses throughout the liver. These most likely represent metastases from metastatic breast cancer or metastatic ovarian carcinoma. However, multifocal hepatocellular carcinoma remains a possibility given additional changes suggesting the possibility of cirrhosis of the liver with a history of alcohol abuse. 3. Multiple sclerotic bone metastases, compatible with metastatic breast cancer. 4. Extensive colonic diverticulosis.  Electronically Signed   By: Claudie Revering M.D.   On: 12/24/2015 20:44   US Venous Img Lower Bilateral  12/14/2015  CLINICAL DATA:  Bilateral leg swelling for 2-3 weeks EXAM: BILATERAL LOWER EXTREMITY VENOUS DOPPLER ULTRASOUND TECHNIQUE: Gray-scale sonography with graded compression, as well as color Doppler and duplex ultrasound were performed to evaluate the lower extremity deep venous systems from  the level of the common femoral vein and including the common femoral, femoral, profunda femoral, popliteal and calf veins including the posterior tibial, peroneal and gastrocnemius veins when visible. The superficial great saphenous vein was also interrogated. Spectral Doppler was utilized to evaluate flow at rest and with distal augmentation maneuvers in the common femoral, femoral and popliteal veins. COMPARISON:  None. FINDINGS: RIGHT LOWER EXTREMITY Common Femoral Vein: No evidence of thrombus. Normal compressibility, respiratory phasicity and response to augmentation. Saphenofemoral Junction: No evidence of thrombus. Normal compressibility and flow on color Doppler imaging. Profunda Femoral Vein: No evidence of thrombus. Normal compressibility and flow on color Doppler imaging. Femoral Vein: No evidence of thrombus. Normal compressibility, respiratory phasicity and response to augmentation. Popliteal Vein: No evidence of thrombus. Normal compressibility, respiratory phasicity and response to augmentation. Calf Veins: No evidence of thrombus. Normal compressibility and flow on color Doppler imaging. Superficial Great Saphenous Vein: No evidence of thrombus. Normal compressibility and flow on color Doppler imaging. Venous Reflux:  None. Other Findings:  None. LEFT LOWER EXTREMITY Common Femoral Vein: No evidence of thrombus. Normal compressibility, respiratory phasicity and response to augmentation. Saphenofemoral Junction: No evidence of thrombus. Normal compressibility and flow on color Doppler imaging. Profunda  Femoral Vein: No evidence of thrombus. Normal compressibility and flow on color Doppler imaging. Femoral Vein: No evidence of thrombus. Normal compressibility, respiratory phasicity and response to augmentation. Popliteal Vein: No evidence of thrombus. Normal compressibility, respiratory phasicity and response to augmentation. Calf Veins: No evidence of thrombus. Normal compressibility and flow on color Doppler imaging. Superficial Great Saphenous Vein: No evidence of thrombus. Normal compressibility and flow on color Doppler imaging. Venous Reflux:  None. Other Findings:  None. IMPRESSION: No evidence of deep venous thrombosis. Electronically Signed   By: Inez Catalina M.D.   On: 12/14/2015 13:31   US Abdomen Limited Ruq  12/24/2015  CLINICAL DATA:  Elevated liver function tests. Alcohol abuse. Hepatomegaly. Personal history of left breast carcinoma. EXAM: US ABDOMEN LIMITED - RIGHT UPPER QUADRANT COMPARISON:  None. FINDINGS: Gallbladder: No gallstones or wall thickening visualized. No sonographic Murphy sign noted by sonographer. Common bile duct: Diameter: 3 mm, within normal limits. Liver: Liver appears enlarged. There are numerous masses seen throughout both the right and left hepatic lobes which are mildly hyperechoic in echotexture. These masses measure up to 4 cm in size. Differential diagnosis includes diffuse hepatic metastases and multifocal hepatocellular carcinoma in setting of cirrhosis. Minimal perihepatic ascites noted. IMPRESSION: Hepatomegaly with multiple diffuse hepatic masses measuring up to 4 cm and mild perihepatic ascites. Differential diagnosis includes hepatic metastases and multifocal hepatocellular carcinoma in setting of cirrhosis. Abdomen MRI without and with contrast recommended for further evaluation. No evidence of gallstones or biliary dilatation. These results will be called to the ordering clinician or representative by the Radiologist Assistant, and communication documented in  the PACS or zVision Dashboard. Electronically Signed   By: Earle Gell M.D.   On: 12/24/2015 08:53    Microbiology: No results found for this or any previous visit (from the past 240 hour(s)).   Labs: Basic Metabolic Panel:  Recent Labs Lab 12/24/15 1812 12/25/15 0502  NA 129*  --   K 4.4  --   CL 98*  --   CO2 22  --   GLUCOSE 119*  --   BUN 30*  --   CREATININE 1.29* 0.81  CALCIUM 8.9  --    Liver Function Tests:  Recent Labs Lab 12/24/15 1812 12/25/15 0502  AST 74*  59*  ALT 44 35  ALKPHOS 240* 213*  BILITOT 0.9 0.9  PROT 6.8 6.0*  ALBUMIN 2.8* 2.5*    Recent Labs Lab 12/24/15 1812  LIPASE 43    Recent Labs Lab 12/24/15 1812  AMMONIA 35   CBC:  Recent Labs Lab 12/24/15 1812 12/25/15 0502  WBC 12.8* 11.2*  NEUTROABS 9.6*  --   HGB 11.3* 10.9*  HCT 34.5* 32.2*  MCV 82.9 82.4  PLT 325 347   Cardiac Enzymes: No results for input(s): CKTOTAL, CKMB, CKMBINDEX, TROPONINI in the last 168 hours. BNP: BNP (last 3 results)  Recent Labs  12/14/15 1225 12/24/15 1812  BNP 327.7* 90.4    ProBNP (last 3 results) No results for input(s): PROBNP in the last 8760 hours.  CBG: No results for input(s): GLUCAP in the last 168 hours.     SignedCristal Ford  [facility] 12/28/2015, 10:52 AM

## 2015-12-28 NOTE — Progress Notes (Signed)
Patient is set to discharge to [facility] today. Patient & daughter-in-law, [name] at bedside made aware. Discharge packet given to RN, Curt Bears. [facility] called for transport to pickup at noon.     Raynaldo Opitz, [facility] Clinical Social Worker cell #: [phone]

## 2015-12-28 NOTE — Discharge Instructions (Signed)
Hospice °Hospice is a service that is designed to provide people who are terminally ill and their families with medical, spiritual, and psychological support. Its aim is to improve your quality of life by keeping you as alert and comfortable as possible. Hospice is performed by a team of health care professionals and volunteers who: °· Help keep you comfortable. Hospice can be provided in your home or in a homelike setting. The hospice staff works with your family and friends to help meet your needs. You will enjoy the support of loved ones by receiving much of your basic care from family and friends. °· Provide pain relief and manage your symptoms. The staff supply all necessary medicines and equipment. °· Provide companionship when you are alone. °· Allow you and your family to rest. They may do light housekeeping, prepare meals, and run errands. °· Provide counseling. They will make sure your emotional, spiritual, and social needs and those of your family are being met. °· Provide spiritual care. Spiritual care is individualized to meet your needs and your family's needs. It may involve helping you look at what death means to you, say goodbye, or perform a specific religious ceremony or ritual. °Hospice teams often include: °· A nurse. °· A doctor. °· Social workers. °· Religious leaders (such as a chaplain). °· Trained volunteers. °WHEN SHOULD HOSPICE CARE BEGIN? °Most people who use hospice are believed to have fewer than 6 months to live. Your family and health care providers can help you decide when hospice services should begin. If your condition improves, you may discontinue the program. °WHAT SHOULD I CONSIDER BEFORE SELECTING A PROGRAM? °Most hospice programs are run by nonprofit, independent organizations. Some are affiliated with hospitals, nursing homes, or home health care agencies. Hospice programs can take place in the home or at a hospice center, hospital, or skilled nursing facility. When choosing  a hospice program, ask the following questions: °· What services are available to me? °· What services are offered to my loved ones? °· How involved are my loved ones? °· How involved is my health care provider? °· Who makes up the hospice care team? How are they trained or screened? °· How will my pain and symptoms be managed? °· If my circumstances change, can the services be provided in a different setting, such as my home or in the hospital? °· Is the program reviewed and licensed by the state or certified in some other way? °WHERE CAN I LEARN MORE ABOUT HOSPICE? °You can learn about existing hospice programs in your area from your health care providers. You can also read more about hospice online. The websites of the following organizations contain helpful information: °· The National Hospice and Palliative Care Organization (NHPCO). °· The Hospice Association of America (HAA). °· The Hospice Education Institute. °· The American Cancer Society (ACS). °· Hospice Net. °  °This information is not intended to replace advice given to you by your health care provider. Make sure you discuss any questions you have with your health care provider. °  °Document Released: 11/28/2003 Document Revised: 08/16/2013 Document Reviewed: 06/21/2013 °Elsevier Interactive Patient Education ©2016 [employer] ° °

## 2015-12-28 NOTE — Progress Notes (Signed)
Pt to b dc/ to [facility]. Full report given to Animal nutritionist at [facility]. PIV removed without complication. Pt stable and ready for transport with daughter at bedside.

## 2016-01-03 ENCOUNTER — Other Ambulatory Visit: Payer: Medicare PPO

## 2016-01-03 ENCOUNTER — Ambulatory Visit: Payer: Medicare PPO | Admitting: Hematology & Oncology

## 2016-01-03 ENCOUNTER — Ambulatory Visit: Payer: Medicare PPO

## 2016-01-24 DEATH — deceased

## 2017-04-10 IMAGING — CT CT CHEST W/ CM
3 of 8 series · 14 of 36 positions shown, 16 images · IV contrast (ISOVUE)
Comparison: Right upper quadrant abdomen ultrasound obtained
earlier today.

CLINICAL DATA: Several recent falls. Clinical concern for
orthostatics hypotension. Recently diagnosed liver metastases or
multifocal hepatocellular carcinoma. History of alcohol abuse and
left breast cancer.

EXAM:
CT CHEST, ABDOMEN, AND PELVIS WITH CONTRAST
TECHNIQUE: Multidetector CT imaging of the chest, abdomen and pelvis was
performed following the standard protocol during bolus
administration of intravenous contrast.
CONTRAST:  75mL MYYLTN-ZXX IOPAMIDOL (MYYLTN-ZXX) INJECTION 61%

[Series 8: c/a/p with · axial · 0.74mm/px · z∈[-514,-84]mm · 8 of 112 slices shown, 10 images]
[im 13/112  mediastinal]
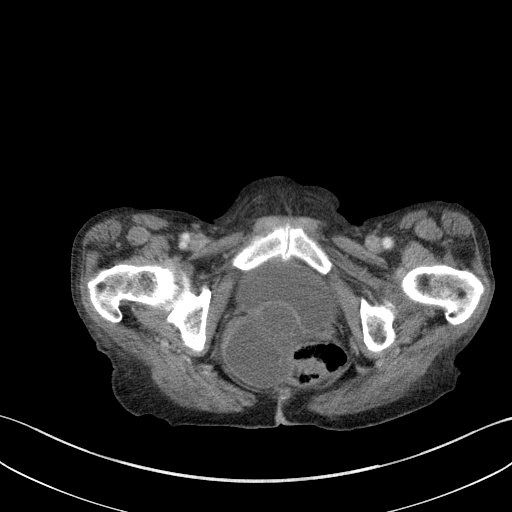
[im 13/112  lung]
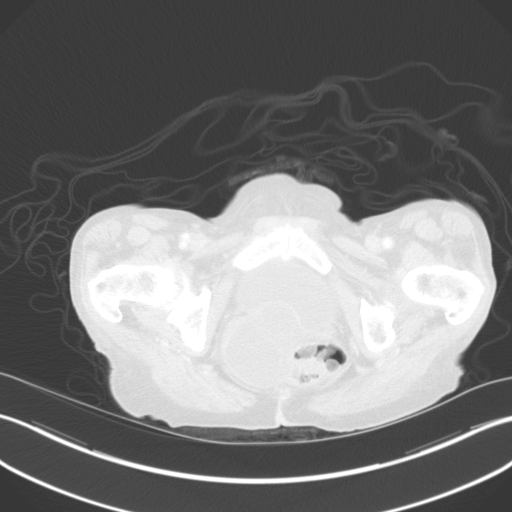
[im 25/112  lung]
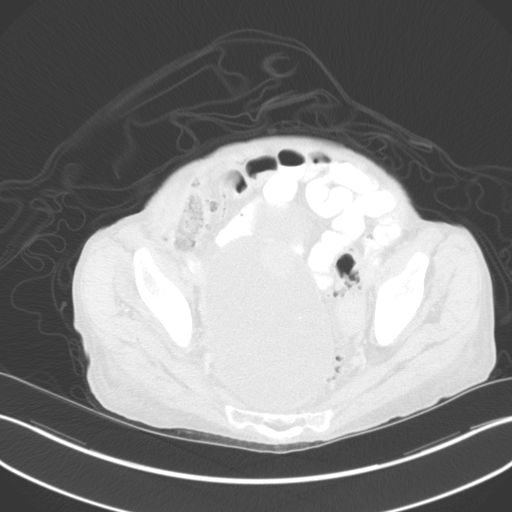
[im 38/112  lung]
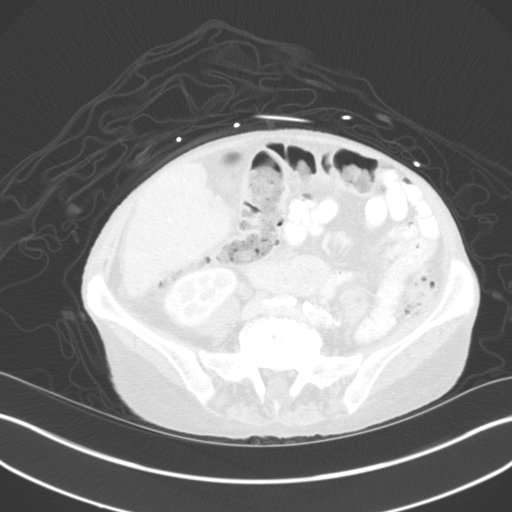
[im 50/112  lung]
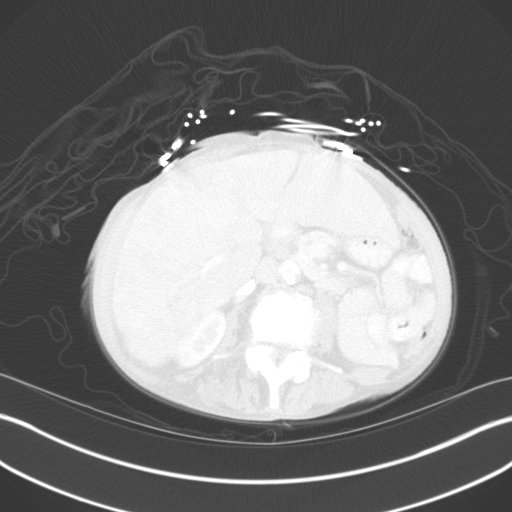
[im 62/112  mediastinal]
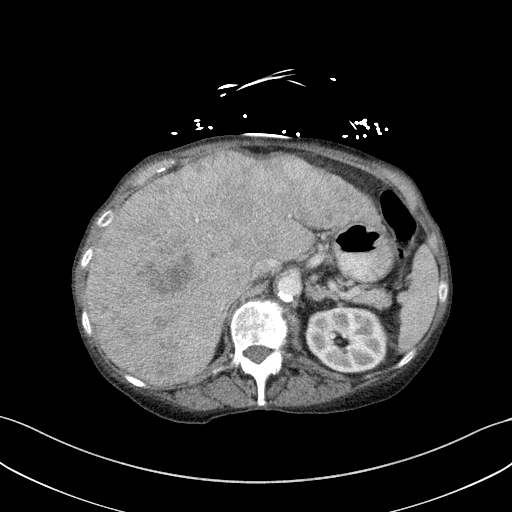
[im 62/112  lung]
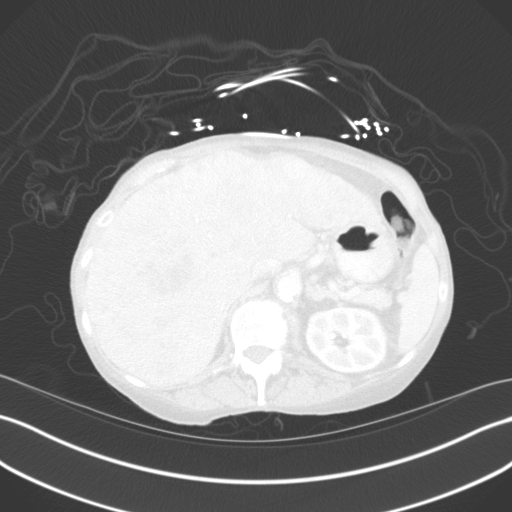
[im 75/112  lung]
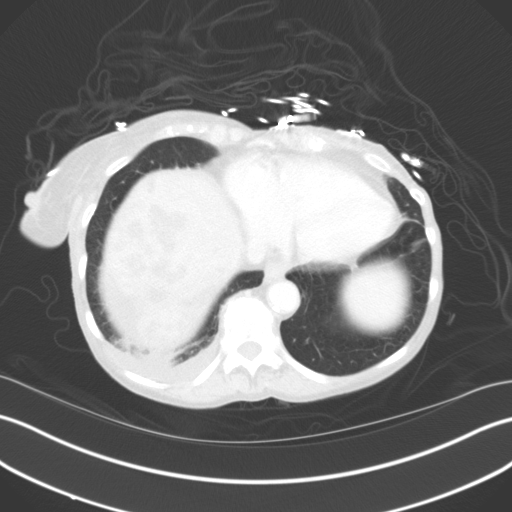
[im 87/112  lung]
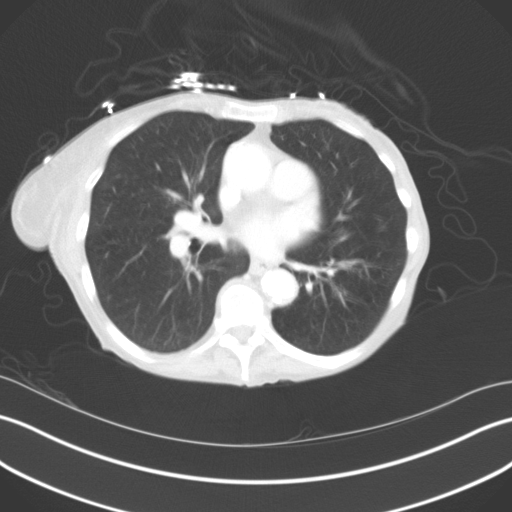
[im 99/112  lung]
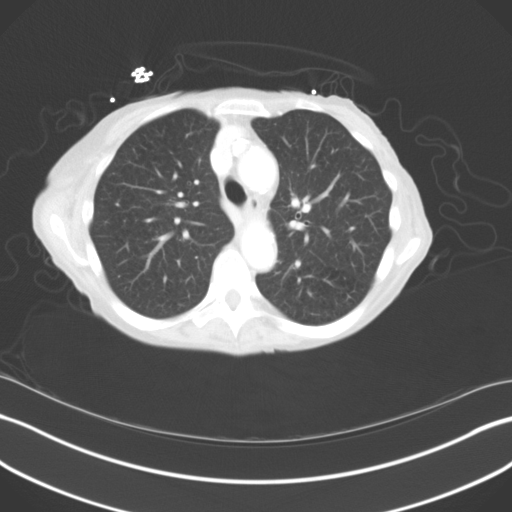

[Series 10: lung windows · axial · 0.74mm/px · z∈[-242,-168]mm · 4 of 124 slices shown]
[im 13/124  lung]
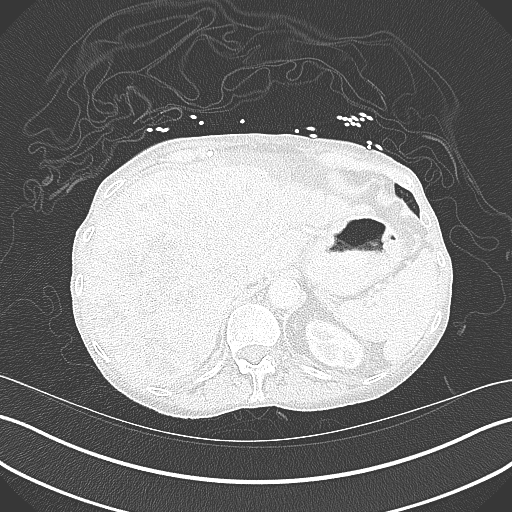
[im 25/124  lung]
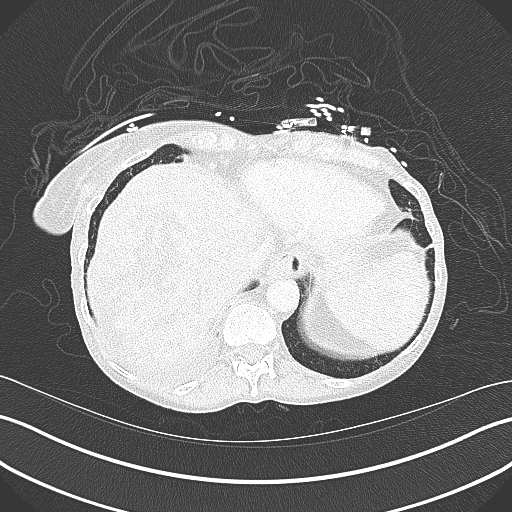
[im 37/124  lung]
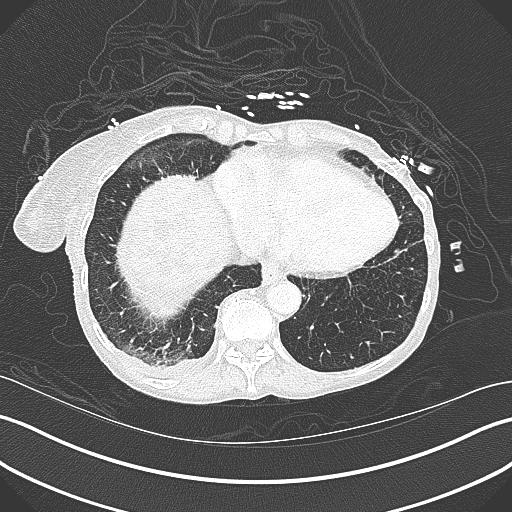
[im 50/124  lung]
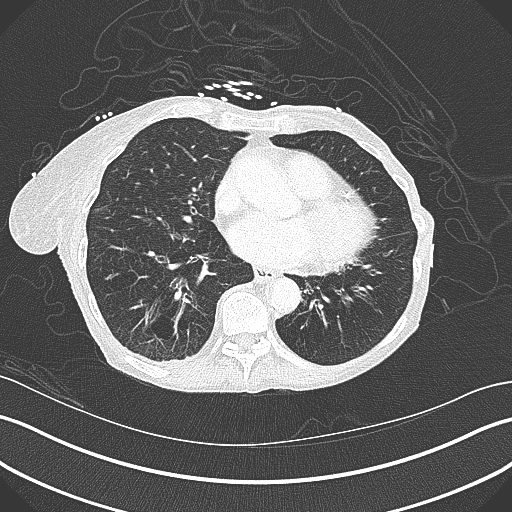

[Series 12: coronal · coronal · 0.74mm/px · 2 of 131 slices shown]
[im 44/131  lung]
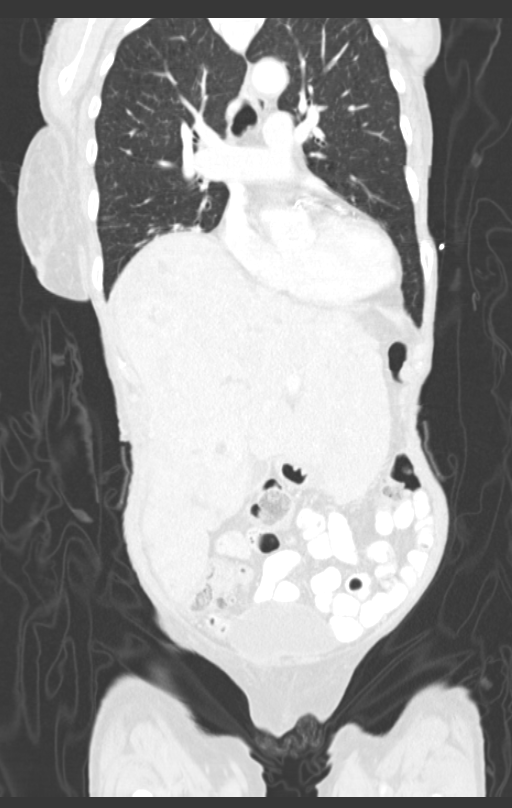
[im 87/131  lung]
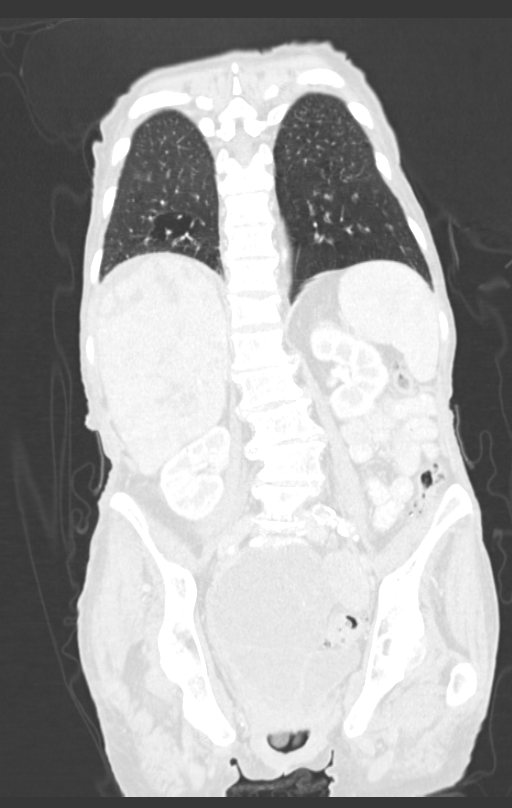

[14 of 36 positions shown; findings below may reference images not displayed]

FINDINGS: CT CHEST FINDINGS

Mediastinum/Lymph Nodes: Dense atheromatous coronary artery
calcifications. No enlarged lymph nodes. No intravascular thrombi
visualized.

Lungs/Pleura: Small right pleural effusion. 1.1 cm irregular nodule
with adjacent pleural tethering in the anterior right lower lobe on
image number 43 of series August 25, 08 cm mildly irregular nodule in the
left lower lobe on image number 57 of series 10. Mild right lower
lobe atelectasis. The lungs are diffusely hyperexpanded with right
lower lobe bullous changes and diffuse peribronchial thickening.

Musculoskeletal: Sclerotic lesion in the superior aspect of the T4
vertebral body. Sclerotic lesion in the posterior aspect of the T9
vertebral body. Sclerotic lesions in the T12 vertebral body.
Approximately 20% T12 superior endplate compression deformity with
sclerosis. No acute fracture lines. No bony retropulsion.
Postmastectomy changes on the left.

CT ABDOMEN PELVIS FINDINGS

Hepatobiliary: Multiple poorly defined liver masses. This is more
pronounced and slightly more confluent in the superior aspect of the
liver on the right. Lobulated liver contours. The liver is also
enlarged. Mildly prominent lateral segment left lobe of the liver
and caudate lobe. Gallbladder Phrygian cap.

Pancreas: No mass, inflammatory changes, or other significant
abnormality.

Spleen: Within normal limits in size and appearance.

Adrenals/Urinary Tract: The right kidney is inferiorly displaced by
the enlarged liver. Otherwise, normal appearing kidneys, ureters and
urinary bladder. No urinary tract calculi or hydronephrosis.

Stomach/Bowel: Small sliding hiatal hernia. Multiple diverticula
throughout the colon. No small bowel abnormalities. Normal appearing
appendix.

Vascular/Lymphatic: Dense atheromatous arterial calcifications. No
enlarged lymph nodes.

Reproductive: Mildly enlarged left ovary. Large multi-septated
cystic and solid right ovarian mass. On image number 90 of series 8,
this measures 16.2 x 10.0 cm in maximum dimensions. On sagittal
image number 76, this measures 12.3 cm in length. Surgically absent
uterus.

Other: No free peritoneal fluid.

Musculoskeletal: Rounded sclerotic focus in the superior, posterior
aspect of the L1 vertebral body. Disc space narrowing, spur
formation and discogenic sclerosis at multiple levels.
IMPRESSION: 1. 16.2 x 12.3 x 10.0 cm cystic and solid right ovarian mass, highly
suspicious for a primary ovarian carcinoma. An ovarian metastasis is
less likely.
2. Hepatomegaly with multiple masses throughout the liver. These
most likely represent metastases from metastatic breast cancer or
metastatic ovarian carcinoma. However, multifocal hepatocellular
carcinoma remains a possibility given additional changes suggesting
the possibility of cirrhosis of the liver with a history of alcohol
abuse.
3. Multiple sclerotic bone metastases, compatible with metastatic
breast cancer.
4. Extensive colonic diverticulosis.
